# Patient Record
Sex: Female | Born: 1960 | Race: White | Hispanic: No | Marital: Married | Smoking: Former smoker
Health system: Southern US, Community
[De-identification: ages and names within clinical notes are randomized; demographics above are authoritative.]

## PROBLEM LIST (undated history)

## (undated) DIAGNOSIS — F329 Major depressive disorder, single episode, unspecified: Secondary | ICD-10-CM

## (undated) DIAGNOSIS — K589 Irritable bowel syndrome without diarrhea: Secondary | ICD-10-CM

## (undated) DIAGNOSIS — K219 Gastro-esophageal reflux disease without esophagitis: Secondary | ICD-10-CM

## (undated) DIAGNOSIS — F419 Anxiety disorder, unspecified: Secondary | ICD-10-CM

## (undated) DIAGNOSIS — J302 Other seasonal allergic rhinitis: Secondary | ICD-10-CM

## (undated) DIAGNOSIS — B9689 Other specified bacterial agents as the cause of diseases classified elsewhere: Secondary | ICD-10-CM

## (undated) DIAGNOSIS — N809 Endometriosis, unspecified: Secondary | ICD-10-CM

## (undated) DIAGNOSIS — F32A Depression, unspecified: Secondary | ICD-10-CM

## (undated) DIAGNOSIS — J45998 Other asthma: Secondary | ICD-10-CM

## (undated) DIAGNOSIS — R112 Nausea with vomiting, unspecified: Secondary | ICD-10-CM

## (undated) DIAGNOSIS — Z973 Presence of spectacles and contact lenses: Secondary | ICD-10-CM

## (undated) DIAGNOSIS — R102 Pelvic and perineal pain: Secondary | ICD-10-CM

## (undated) DIAGNOSIS — N760 Acute vaginitis: Secondary | ICD-10-CM

## (undated) DIAGNOSIS — Z9889 Other specified postprocedural states: Secondary | ICD-10-CM

## (undated) DIAGNOSIS — IMO0002 Reserved for concepts with insufficient information to code with codable children: Secondary | ICD-10-CM

## (undated) DIAGNOSIS — R350 Frequency of micturition: Secondary | ICD-10-CM

## (undated) DIAGNOSIS — E739 Lactose intolerance, unspecified: Secondary | ICD-10-CM

## (undated) HISTORY — DX: Gastro-esophageal reflux disease without esophagitis: K21.9

## (undated) HISTORY — DX: Irritable bowel syndrome, unspecified: K58.9

## (undated) HISTORY — PX: LAPAROSCOPIC OVARIAN CYSTECTOMY: SUR786

## (undated) HISTORY — DX: Other specified bacterial agents as the cause of diseases classified elsewhere: B96.89

## (undated) HISTORY — DX: Other seasonal allergic rhinitis: J30.2

## (undated) HISTORY — PX: LAPAROSCOPIC OOPHERECTOMY: SHX6507

## (undated) HISTORY — PX: TUBAL LIGATION: SHX77

## (undated) HISTORY — DX: Acute vaginitis: N76.0

---

## 1997-03-27 HISTORY — PX: LAPAROSCOPIC ASSISTED VAGINAL HYSTERECTOMY: SHX5398

## 1997-11-23 ENCOUNTER — Ambulatory Visit (HOSPITAL_COMMUNITY): Admission: RE | Admit: 1997-11-23 | Discharge: 1997-11-23 | Payer: Self-pay | Admitting: *Deleted

## 1998-01-25 ENCOUNTER — Observation Stay (HOSPITAL_COMMUNITY): Admission: RE | Admit: 1998-01-25 | Discharge: 1998-01-26 | Payer: Self-pay | Admitting: *Deleted

## 1998-06-23 ENCOUNTER — Other Ambulatory Visit: Admission: RE | Admit: 1998-06-23 | Discharge: 1998-06-23 | Payer: Self-pay | Admitting: *Deleted

## 1999-08-03 ENCOUNTER — Other Ambulatory Visit: Admission: RE | Admit: 1999-08-03 | Discharge: 1999-08-03 | Payer: Self-pay | Admitting: *Deleted

## 1999-09-23 ENCOUNTER — Ambulatory Visit (HOSPITAL_COMMUNITY): Admission: RE | Admit: 1999-09-23 | Discharge: 1999-09-23 | Payer: Self-pay | Admitting: Gastroenterology

## 2000-08-21 ENCOUNTER — Other Ambulatory Visit: Admission: RE | Admit: 2000-08-21 | Discharge: 2000-08-21 | Payer: Self-pay | Admitting: *Deleted

## 2001-09-03 ENCOUNTER — Other Ambulatory Visit: Admission: RE | Admit: 2001-09-03 | Discharge: 2001-09-03 | Payer: Self-pay | Admitting: *Deleted

## 2002-02-19 ENCOUNTER — Encounter: Payer: Self-pay | Admitting: *Deleted

## 2002-02-19 ENCOUNTER — Ambulatory Visit (HOSPITAL_COMMUNITY): Admission: RE | Admit: 2002-02-19 | Discharge: 2002-02-19 | Payer: Self-pay | Admitting: *Deleted

## 2002-11-12 ENCOUNTER — Other Ambulatory Visit: Admission: RE | Admit: 2002-11-12 | Discharge: 2002-11-12 | Payer: Self-pay | Admitting: *Deleted

## 2003-03-02 ENCOUNTER — Ambulatory Visit (HOSPITAL_COMMUNITY): Admission: RE | Admit: 2003-03-02 | Discharge: 2003-03-02 | Payer: Self-pay | Admitting: *Deleted

## 2003-12-29 ENCOUNTER — Other Ambulatory Visit: Admission: RE | Admit: 2003-12-29 | Discharge: 2003-12-29 | Payer: Self-pay | Admitting: *Deleted

## 2004-04-13 ENCOUNTER — Ambulatory Visit (HOSPITAL_COMMUNITY): Admission: RE | Admit: 2004-04-13 | Discharge: 2004-04-13 | Payer: Self-pay | Admitting: *Deleted

## 2004-12-29 ENCOUNTER — Other Ambulatory Visit: Admission: RE | Admit: 2004-12-29 | Discharge: 2004-12-29 | Payer: Self-pay | Admitting: *Deleted

## 2005-05-19 ENCOUNTER — Ambulatory Visit (HOSPITAL_COMMUNITY): Admission: RE | Admit: 2005-05-19 | Discharge: 2005-05-19 | Payer: Self-pay | Admitting: *Deleted

## 2006-01-16 ENCOUNTER — Other Ambulatory Visit: Admission: RE | Admit: 2006-01-16 | Discharge: 2006-01-16 | Payer: Self-pay | Admitting: *Deleted

## 2006-05-21 ENCOUNTER — Ambulatory Visit (HOSPITAL_COMMUNITY): Admission: RE | Admit: 2006-05-21 | Discharge: 2006-05-21 | Payer: Self-pay | Admitting: *Deleted

## 2007-01-24 ENCOUNTER — Other Ambulatory Visit: Admission: RE | Admit: 2007-01-24 | Discharge: 2007-01-24 | Payer: Self-pay | Admitting: *Deleted

## 2007-05-30 ENCOUNTER — Ambulatory Visit (HOSPITAL_COMMUNITY): Admission: RE | Admit: 2007-05-30 | Discharge: 2007-05-30 | Payer: Self-pay | Admitting: *Deleted

## 2008-04-07 ENCOUNTER — Other Ambulatory Visit: Admission: RE | Admit: 2008-04-07 | Discharge: 2008-04-07 | Payer: Self-pay | Admitting: Family Medicine

## 2008-06-02 ENCOUNTER — Ambulatory Visit (HOSPITAL_COMMUNITY): Admission: RE | Admit: 2008-06-02 | Discharge: 2008-06-02 | Payer: Self-pay | Admitting: Family Medicine

## 2009-06-22 ENCOUNTER — Ambulatory Visit (HOSPITAL_COMMUNITY): Admission: RE | Admit: 2009-06-22 | Discharge: 2009-06-22 | Payer: Self-pay | Admitting: Family Medicine

## 2010-05-16 ENCOUNTER — Other Ambulatory Visit (HOSPITAL_COMMUNITY): Payer: Self-pay | Admitting: Family Medicine

## 2010-05-16 DIAGNOSIS — Z1231 Encounter for screening mammogram for malignant neoplasm of breast: Secondary | ICD-10-CM

## 2010-06-30 ENCOUNTER — Ambulatory Visit (HOSPITAL_COMMUNITY)
Admission: RE | Admit: 2010-06-30 | Discharge: 2010-06-30 | Disposition: A | Discharge status: Home / Self Care | Payer: 59 | Source: Ambulatory Visit | Attending: Family Medicine | Admitting: Family Medicine

## 2010-06-30 DIAGNOSIS — Z1231 Encounter for screening mammogram for malignant neoplasm of breast: Secondary | ICD-10-CM | POA: Insufficient documentation

## 2011-06-26 ENCOUNTER — Other Ambulatory Visit (HOSPITAL_COMMUNITY): Payer: Self-pay | Admitting: Obstetrics and Gynecology

## 2011-06-26 DIAGNOSIS — Z1231 Encounter for screening mammogram for malignant neoplasm of breast: Secondary | ICD-10-CM

## 2011-07-20 ENCOUNTER — Ambulatory Visit (HOSPITAL_COMMUNITY)
Admission: RE | Admit: 2011-07-20 | Discharge: 2011-07-20 | Disposition: A | Discharge status: Home / Self Care | Payer: 59 | Source: Ambulatory Visit | Attending: Obstetrics and Gynecology | Admitting: Obstetrics and Gynecology

## 2011-07-20 DIAGNOSIS — Z1231 Encounter for screening mammogram for malignant neoplasm of breast: Secondary | ICD-10-CM | POA: Insufficient documentation

## 2011-12-06 ENCOUNTER — Ambulatory Visit
Admission: RE | Admit: 2011-12-06 | Discharge: 2011-12-06 | Disposition: A | Discharge status: Home / Self Care | Payer: 59 | Source: Ambulatory Visit | Attending: Family Medicine | Admitting: Family Medicine

## 2011-12-06 ENCOUNTER — Other Ambulatory Visit: Payer: Self-pay | Admitting: Family Medicine

## 2011-12-06 DIAGNOSIS — R0781 Pleurodynia: Secondary | ICD-10-CM

## 2012-09-09 ENCOUNTER — Ambulatory Visit
Admission: RE | Admit: 2012-09-09 | Discharge: 2012-09-09 | Disposition: A | Discharge status: Home / Self Care | Payer: 59 | Source: Ambulatory Visit

## 2012-09-09 ENCOUNTER — Other Ambulatory Visit: Payer: Self-pay

## 2012-09-09 DIAGNOSIS — R0602 Shortness of breath: Secondary | ICD-10-CM

## 2012-09-11 ENCOUNTER — Encounter: Payer: Self-pay | Admitting: Gastroenterology

## 2012-10-08 ENCOUNTER — Encounter: Payer: Self-pay | Admitting: Gastroenterology

## 2012-10-14 ENCOUNTER — Encounter: Payer: Self-pay | Admitting: Gastroenterology

## 2012-10-14 ENCOUNTER — Ambulatory Visit (INDEPENDENT_AMBULATORY_CARE_PROVIDER_SITE_OTHER): Payer: 59 | Admitting: Gastroenterology

## 2012-10-14 VITALS — BP 110/80 | HR 74 | Ht 62.5 in | Wt 120.2 lb

## 2012-10-14 DIAGNOSIS — R1310 Dysphagia, unspecified: Secondary | ICD-10-CM

## 2012-10-14 DIAGNOSIS — Z1211 Encounter for screening for malignant neoplasm of colon: Secondary | ICD-10-CM

## 2012-10-14 NOTE — Patient Instructions (Addendum)
You have been scheduled for an endoscopy with propofol. Please follow written instructions given to you at your visit today. If you use inhalers (even only as needed), please bring them with you on the day of your procedure. Your physician has requested that you go to www.startemmi.com and enter the access code given to you at your visit today. This web site gives a general overview about your procedure. However, you should still follow specific instructions given to you by our office regarding your preparation for the procedure.  You have been scheduled for a colonoscopy with propofol. Please follow written instructions given to you at your visit today.  Please pick up your prep kit at the pharmacy within the next 1-3 days. If you use inhalers (even only as needed), please bring them with you on the day of your procedure. Your physician has requested that you go to www.[email or web address] and enter the access code given to you at your visit today. This web site gives a general overview about your procedure. However, you should still follow specific instructions given to you by our office regarding your preparation for the procedure. 

## 2012-10-14 NOTE — Assessment & Plan Note (Signed)
Dysphagia may be do to a recurrent esophageal stricture. Candida esophagitis should be ruled out.  Recommendations #1 upper endoscopy with dilatation as indicated

## 2012-10-14 NOTE — Assessment & Plan Note (Signed)
Plan screening colonoscopy 

## 2012-10-14 NOTE — Progress Notes (Signed)
History of Present Illness: 52 year old white female referred for colorectal cancer screening. She has a questionable remote history of Crohn's disease. Last colonoscopy in 1998 was negative. Bowels are somewhat irregular which she attributes to irritable bowel.  Patient complains of dysphagia to solids. She apparently has a history of an esophageal stricture. She has recently been treated for asthma including prednisone. She is taking fluconazole as well.    Past Medical History  Diagnosis Date  . GERD (gastroesophageal reflux disease)   . Irritable bowel syndrome   . Food intolerance     dairy   History reviewed. No pertinent past surgical history. family history is negative for Colon cancer and Colon polyps. Current Outpatient Prescriptions  Medication Sig Dispense Refill  . Calcium Carbonate-Vitamin D (CALCIUM + D PO) Take by mouth. Two daily      . Hyoscyamine Sulfate (LEVSIN/SL SL) Place under the tongue. As needed      . Multiple Vitamins-Minerals (MULTIVITAMIN PO) Take by mouth.      . Ranitidine HCl (ZANTAC PO) Take by mouth. Twice daily      . ALPRAZolam (XANAX) 1 MG tablet As needed      . amitriptyline (ELAVIL) 10 MG tablet       . nefazodone (SERZONE) 150 MG tablet        No current facility-administered medications for this visit.   Allergies as of 10/14/2012 - Review Complete 10/14/2012  Allergen Reaction Noted  . Est estrogens-methyltest Other (See Comments) 10/14/2012  . Penicillins Rash 10/14/2012    reports that she has quit smoking. She has never used smokeless tobacco. She reports that  drinks alcohol. She reports that she does not use illicit drugs.     Review of Systems: Pertinent positive and negative review of systems were noted in the above HPI section. All other review of systems were otherwise negative.  Vital signs were reviewed in today's medical record Physical Exam: General: Well developed , well nourished, no acute distress Skin:  anicteric Head: Normocephalic and atraumatic Eyes:  sclerae anicteric, EOMI Ears: Normal auditory acuity Mouth: No deformity or lesions Neck: Supple, no masses or thyromegaly Lungs: Clear throughout to auscultation Heart: Regular rate and rhythm; no murmurs, rubs or bruits Abdomen: Soft, non tender and non distended. No masses, hepatosplenomegaly or hernias noted. Normal Bowel sounds Rectal:deferred Musculoskeletal: Symmetrical with no gross deformities  Skin: No lesions on visible extremities Pulses:  Normal pulses noted Extremities: No clubbing, cyanosis, edema or deformities noted Neurological: Alert oriented x 4, grossly nonfocal Cervical Nodes:  No significant cervical adenopathy Inguinal Nodes: No significant inguinal adenopathy Psychological:  Alert and cooperative. Normal mood and affect

## 2012-10-15 ENCOUNTER — Encounter: Payer: Self-pay | Admitting: Gastroenterology

## 2012-10-16 ENCOUNTER — Telehealth: Payer: Self-pay | Admitting: Gastroenterology

## 2012-10-16 NOTE — Telephone Encounter (Signed)
Pt thought she was supposed to have an EGD and then be scheduled for the colon. She has her colon scheduled 10/24/12 and her EGD is scheduled for 11/05/12. Pt wants to make sure this is ok with Dr. Arlyce Dice. Please advise.

## 2012-10-16 NOTE — Telephone Encounter (Signed)
Spoke with pt and she is aware.

## 2012-10-16 NOTE — Telephone Encounter (Signed)
OK.  I thought that she should have her EGD first since she has dysphagia but the order it is not critical

## 2012-10-24 ENCOUNTER — Ambulatory Visit (AMBULATORY_SURGERY_CENTER): Payer: 59 | Admitting: Gastroenterology

## 2012-10-24 ENCOUNTER — Encounter: Payer: Self-pay | Admitting: Gastroenterology

## 2012-10-24 VITALS — BP 121/71 | HR 63 | Temp 98.3°F | Resp 17 | Ht 62.5 in | Wt 120.0 lb

## 2012-10-24 DIAGNOSIS — Z1211 Encounter for screening for malignant neoplasm of colon: Secondary | ICD-10-CM

## 2012-10-24 HISTORY — PX: COLONOSCOPY WITH PROPOFOL: SHX5780

## 2012-10-24 MED ORDER — SODIUM CHLORIDE 0.9 % IV SOLN: 500.0000 mL | INTRAVENOUS | Status: DC

## 2012-10-24 NOTE — Patient Instructions (Signed)
YOU HAD AN ENDOSCOPIC PROCEDURE TODAY AT THE [facility]: Refer to the procedure report that was given to you for any specific questions about what was found during the examination.  If the procedure report does not answer your questions, please call your gastroenterologist to clarify.  If you requested that your care partner not be given the details of your procedure findings, then the procedure report has been included in a sealed envelope for you to review at your convenience later.  YOU SHOULD EXPECT: Some feelings of bloating in the abdomen. Passage of more gas than usual.  Walking can help get rid of the air that was put into your GI tract during the procedure and reduce the bloating. If you had a lower endoscopy (such as a colonoscopy or flexible sigmoidoscopy) you may notice spotting of blood in your stool or on the toilet paper. If you underwent a bowel prep for your procedure, then you may not have a normal bowel movement for a few days.  DIET: Your first meal following the procedure should be a light meal and then it is ok to progress to your normal diet.  A half-sandwich or bowl of soup is an example of a good first meal.  Heavy or fried foods are harder to digest and may make you feel nauseous or bloated.  Likewise meals heavy in dairy and vegetables can cause extra gas to form and this can also increase the bloating.  Drink plenty of fluids but you should avoid alcoholic beverages for 24 hours.  ACTIVITY: Your care partner should take you home directly after the procedure.  You should plan to take it easy, moving slowly for the rest of the day.  You can resume normal activity the day after the procedure however you should NOT DRIVE or use heavy machinery for 24 hours (because of the sedation medicines used during the test).    SYMPTOMS TO REPORT IMMEDIATELY: A gastroenterologist can be reached at any hour.  During normal business hours, 8:30 AM to 5:00 PM Monday through Friday,  call [phone].  After hours and on weekends, please call the GI answering service at [phone] who will take a message and have the physician on call contact you.   Following lower endoscopy (colonoscopy or flexible sigmoidoscopy):  Excessive amounts of blood in the stool  Significant tenderness or worsening of abdominal pains  Swelling of the abdomen that is new, acute  Fever of 100F or higher    FOLLOW UP: If any biopsies were taken you will be contacted by phone or by letter within the next 1-3 weeks.  Call your gastroenterologist if you have not heard about the biopsies in 3 weeks.  Our staff will call the home number listed on your records the next business day following your procedure to check on you and address any questions or concerns that you may have at that time regarding the information given to you following your procedure. This is a courtesy call and so if there is no answer at the home number and we have not heard from you through the emergency physician on call, we will assume that you have returned to your regular daily activities without incident.  SIGNATURES/CONFIDENTIALITY: You and/or your care partner have signed paperwork which will be entered into your electronic medical record.  These signatures attest to the fact that that the information above on your After Visit Summary has been reviewed and is understood.  Full responsibility of the confidentiality   of this discharge information lies with you and/or your care-partner.     

## 2012-10-24 NOTE — Op Note (Signed)
[facility] [address] [place], [address]   COLONOSCOPY PROCEDURE REPORT  PATIENT: Mikayla, Montes  MR#: [id number] BIRTHDATE: 1960-06-09 , 51  yrs. old GENDER: Female ENDOSCOPIST: Louis Meckel, MD REFERRED [id number] Foy [place], M.D. PROCEDURE DATE:  10/24/2012 PROCEDURE:   Colonoscopy, diagnostic First Screening Colonoscopy - Avg.  risk and is 50 yrs.  old or older Yes.  Prior Negative Screening - Now for repeat screening. N/A  History of Adenoma - Now for follow-up colonoscopy & has been > or = to 3 yrs.  N/A  Polyps Removed Today? No.  Recommend repeat exam, <10 yrs? No. ASA CLASS:   Class II INDICATIONS:Average risk patient for colon cancer. MEDICATIONS: MAC sedation, administered by CRNA and propofol (Diprivan) 200mg  IV  DESCRIPTION OF PROCEDURE:   After the risks benefits and alternatives of the procedure were thoroughly explained, informed consent was obtained.  A digital rectal exam revealed no abnormalities of the rectum.   The [id number]  endoscope was introduced through the anus and advanced to the cecum, which was identified by both the appendix and ileocecal valve. No adverse events experienced.   The quality of the prep was excellent using Suprep  The instrument was then slowly withdrawn as the colon was fully examined.      COLON FINDINGS: A normal appearing cecum, ileocecal valve, and appendiceal orifice were identified.  The ascending, hepatic flexure, transverse, splenic flexure, descending, sigmoid colon and rectum appeared unremarkable.  No polyps or cancers were seen. Retroflexed views revealed no abnormalities. The time to cecum=4 minutes 34 seconds.  Withdrawal time=7 minutes 50 seconds.  The scope was withdrawn and the procedure completed. COMPLICATIONS: There were no complications.  ENDOSCOPIC IMPRESSION: Normal colon  RECOMMENDATIONS: Continue current colorectal screening recommendations for "routine risk"  patients with a repeat colonoscopy in 10 years.   eSigned:  Louis Meckel, MD 10/24/2012 1:56 PM   cc:

## 2012-10-24 NOTE — Progress Notes (Signed)
No egg or soy allergy. ewm 

## 2012-10-24 NOTE — Progress Notes (Signed)
Patient did not experience any of the following events: a burn prior to discharge; a fall within the facility; wrong site/side/patient/procedure/implant event; or a hospital transfer or hospital admission upon discharge from the facility. (G8907) Patient did not have preoperative order for IV antibiotic SSI prophylaxis. (G8918)  

## 2012-10-24 NOTE — Progress Notes (Signed)
Procedure ends, to recovery, report given and VSS. 

## 2012-10-25 ENCOUNTER — Encounter: Payer: Self-pay | Admitting: *Deleted

## 2012-10-25 ENCOUNTER — Telehealth: Payer: Self-pay

## 2012-10-25 NOTE — Telephone Encounter (Signed)
  Follow up Call-  Call back number 10/24/2012  Post procedure Call Back phone  # [phone]  Permission to leave phone message Yes     Patient questions:  Do you have a fever, pain , or abdominal swelling? no Pain Score  0 *  Have you tolerated food without any problems? yes  Have you been able to return to your normal activities? yes  Do you have any questions about your discharge instructions: Diet   no Medications  no Follow up visit  no  Do you have questions or concerns about your Care? no  Actions: * If pain score is 4 or above: No action needed, pain <4.

## 2012-10-25 NOTE — Telephone Encounter (Signed)
Error, someone already called

## 2012-10-30 ENCOUNTER — Other Ambulatory Visit (HOSPITAL_COMMUNITY): Payer: Self-pay | Admitting: Obstetrics and Gynecology

## 2012-10-30 DIAGNOSIS — Z1231 Encounter for screening mammogram for malignant neoplasm of breast: Secondary | ICD-10-CM

## 2012-11-04 ENCOUNTER — Telehealth: Payer: Self-pay | Admitting: *Deleted

## 2012-11-04 NOTE — Telephone Encounter (Signed)
Open in error

## 2012-11-05 ENCOUNTER — Encounter: Payer: 59 | Admitting: Gastroenterology

## 2012-11-20 ENCOUNTER — Ambulatory Visit (HOSPITAL_COMMUNITY): Payer: 59

## 2012-12-05 ENCOUNTER — Ambulatory Visit (HOSPITAL_COMMUNITY)
Admission: RE | Admit: 2012-12-05 | Discharge: 2012-12-05 | Disposition: A | Discharge status: Home / Self Care | Payer: 59 | Source: Ambulatory Visit | Attending: Obstetrics and Gynecology | Admitting: Obstetrics and Gynecology

## 2012-12-05 DIAGNOSIS — Z1231 Encounter for screening mammogram for malignant neoplasm of breast: Secondary | ICD-10-CM | POA: Insufficient documentation

## 2013-08-22 ENCOUNTER — Encounter: Payer: Self-pay | Admitting: Gynecology

## 2013-08-22 ENCOUNTER — Ambulatory Visit (INDEPENDENT_AMBULATORY_CARE_PROVIDER_SITE_OTHER): Payer: 59 | Admitting: Gynecology

## 2013-08-22 VITALS — BP 112/66 | Ht 62.0 in | Wt 114.0 lb

## 2013-08-22 DIAGNOSIS — R102 Pelvic and perineal pain: Secondary | ICD-10-CM

## 2013-08-22 DIAGNOSIS — N898 Other specified noninflammatory disorders of vagina: Secondary | ICD-10-CM

## 2013-08-22 DIAGNOSIS — N949 Unspecified condition associated with female genital organs and menstrual cycle: Secondary | ICD-10-CM

## 2013-08-22 LAB — WET PREP FOR TRICH, YEAST, CLUE
CLUE CELLS WET PREP: NONE SEEN
Trich, Wet Prep: NONE SEEN
WBC WET PREP: NONE SEEN
YEAST WET PREP: NONE SEEN

## 2013-08-22 MED ORDER — ESTROGENS, CONJUGATED 0.625 MG/GM VA CREA: TOPICAL_CREAM | VAGINAL | Status: DC

## 2013-08-22 NOTE — Progress Notes (Signed)
Mikayla Montes 1961-02-24 [id number]        53 y.o.  G1P0 new patient presents with a complex history to include abdominal hysterectomy late 1990s for endometriosis. Subsequent laparoscopy x3 by Dr. Warnell Forester for ovarian cysts. (No records available to review at this time)  Started on estrogen replacement at that time with 2 mg of Estrace daily, had tried other products such as vaginal estrogen and Estring. She did have an episode of pelvic pain/vaginal burning similar to now which ultimately resolved. Had done well historically until several months ago with the onset of intense episodic vaginal/rectal burning described as "hot curling iron inside". It occurs multiple times daily. Also notes urinary frequency with some pain. Had been seen by Dr. Emilie Rutter on multiple occasions most recently and currently is using Percocet 10-325 mg for the pain. Elavil 25 mg at bedtime. They increased her Estrace to 4 mg daily thinking that it was due to hypoestrogenic atrophic vaginal changes. Saw Dr. Matilde Sprang who did not feel it was urologic although did no studies by her history and did not do cystoscopy. She denies having ultrasounds CT scans or other studies. Is not sexually active because of the pain. No nausea vomiting diarrhea constipation. No radiation of the pain into her extremities or back.   Past medical history,surgical history, problem list, medications, allergies, family history and social history were all reviewed and documented as reviewed in the EPIC chart.  ROS:  Performed with pertinent positives and negatives in the history of present illness   Exam: Kim assistant Filed Vitals:   08/22/13 1150  BP: 112/66  Height: 5\' 2"  (1.575 m)  Weight: 114 lb (51.71 kg)   General appearance:  Normal affect, orientation and appearance. Skin: Grossly normal Spine straight without CVA tenderness. Abdominal:  Active bowel sounds. Soft, nontender, without masses, guarding, rebound, organomegaly or  hernia Pelvic:  Ext/BUS/vagina with white vaginal cream otherwise normal. Digital exam elicits no discomfort  Adnexa  Without masses or tenderness    Anus and perineum  Normal   Rectovaginal  Normal sphincter tone without palpated masses or tenderness.    Assessment/Plan:  53 y.o. G1P0 new patient with several month history of intense pelvic/vaginal/rectal burning episodic pain requiring narcotic pain relief. Was being treated for hypoestrogenic etiology currently on 4 mg of Estrace. Was started on Premarin vaginal cream but used it once and had an episode of vomiting which also followed taking a narcotic pill, she called the office who prescribed it and was told to stop the Premarin cream. Vagina appears well estrogenized. History of endometriosis status post hysterectomy. Multiple laparoscopies for "ovarian cysts". Urologic evaluation negative historically although no cystoscopy was done. Exam today is normal without any pain on abdominal or pelvic exam. Differential to include GYN etiology such as endometriosis/adhesions versus urologic such as interstitial cystitis given her history of frequency and some discomfort with urination. GI less likely without history of bowel changes such as nausea vomiting diarrhea constipation. Possible orthopedic although no radiation or association with joint movement. Will awaken with the pain at times. Will start with pelvic ultrasound to rule out nonpalpable abnormalities. Decrease estradiol to 1 mg daily, continue Premarin vaginal cream twice weekly for now, continue Elavil 25 mg at bedtime, limit or stop her narcotic use. Assuming ultrasound negative from my standpoint next up would be laparoscopy. I do think that she needs cystoscopy to rule out interstitial cystitis whether that should be done first we will discuss. Ultimate possible referral to the pelvic  pain clinic at [facility] also discussed.  The patient does have Dr Collier Bullock original records at home and she's  going to look through them and bring copies of any operative reports that she may have. I will also attempt to get copies from the hospital.   Note: This document was prepared with digital dictation and possible smart phrase technology. Any transcriptional errors that result from this process are unintentional.   Anastasio Auerbach MD, 12:31 PM 08/22/2013

## 2013-08-22 NOTE — Patient Instructions (Signed)
Followup for ultrasound as scheduled. Use the Premarin vaginal cream twice weekly. Decrease the oral estrogen to 1 mg daily. Stay on the Elavil 25 mg at bedtime Try to limit/stop the use of the narcotics.

## 2013-08-23 LAB — URINALYSIS W MICROSCOPIC + REFLEX CULTURE
BACTERIA UA: NONE SEEN
Bilirubin Urine: NEGATIVE
CASTS: NONE SEEN
Crystals: NONE SEEN
Glucose, UA: NEGATIVE mg/dL
Hgb urine dipstick: NEGATIVE
KETONES UR: NEGATIVE mg/dL
Leukocytes, UA: NEGATIVE
NITRITE: NEGATIVE
PH: 7.5 (ref 5.0–8.0)
Protein, ur: NEGATIVE mg/dL
Specific Gravity, Urine: 1.005 (ref 1.005–1.030)
UROBILINOGEN UA: 0.2 mg/dL (ref 0.0–1.0)

## 2013-08-27 ENCOUNTER — Encounter: Payer: Self-pay | Admitting: Gynecology

## 2013-08-27 ENCOUNTER — Other Ambulatory Visit: Payer: Self-pay | Admitting: Gynecology

## 2013-08-27 ENCOUNTER — Ambulatory Visit (INDEPENDENT_AMBULATORY_CARE_PROVIDER_SITE_OTHER): Payer: 59 | Admitting: Gynecology

## 2013-08-27 ENCOUNTER — Ambulatory Visit (INDEPENDENT_AMBULATORY_CARE_PROVIDER_SITE_OTHER): Payer: 59

## 2013-08-27 DIAGNOSIS — IMO0002 Reserved for concepts with insufficient information to code with codable children: Secondary | ICD-10-CM

## 2013-08-27 DIAGNOSIS — N83209 Unspecified ovarian cyst, unspecified side: Secondary | ICD-10-CM

## 2013-08-27 DIAGNOSIS — N949 Unspecified condition associated with female genital organs and menstrual cycle: Secondary | ICD-10-CM

## 2013-08-27 DIAGNOSIS — Z8742 Personal history of other diseases of the female genital tract: Secondary | ICD-10-CM

## 2013-08-27 DIAGNOSIS — R102 Pelvic and perineal pain unspecified side: Secondary | ICD-10-CM

## 2013-08-27 NOTE — Progress Notes (Signed)
Mikayla Montes [birth year] [id number]        53 y.o.  G1P0 presents in followup for ultrasound. Very complex history to include recurrent bouts of pelvic pain and vaginal burning. Previously taken care of by Dr. Warnell Forester status post laparoscopy x3 by patient's history in the late 1990s. I do have one operative report she brought where he described ovarian cysts, periovarian adhesions and presumed endometriosis all of which he ablated. No pathology specimens were taken. Subsequently underwent LAVH 1999 where again endometriosis was described and ovarian cysts. These were ablated. The uterine pathology was normal with the exception of some serosal adhesions but no evidence of endometriosis. Had been on ERT since then for hot flushes night sweats and not feeling well and had been on several different formulations. Most recently saw Dr. Milta Deiters for her recurrent pelvic pain and burning and she presented to me on Estrace 4 mg daily, vaginal Premarin cream and narcotic pain medication. Patient notes that the pain has been going on for 2 months and is incapacitating although on my last physical exam she had no pain on abdominal or pelvic exam. She relates some back pain and radiation into her legs and that this was identical pain that she had previously with the laparoscopies that resolved after ablation of her ovarian cysts and presumed endometriosis.  Past medical history,surgical history, problem list, medications, allergies, family history and social history were all reviewed and documented in the EPIC chart.  Directed ROS with pertinent positives and negatives documented in the history of present illness/assessment and plan.  Ultrasound shows right and left ovaries visualized with 21 mm echo-free avascular right ovarian cyst. Cul-de-sac negative.  Assessment/Plan:  53 y.o. G1P0 with complex history as outlined above. Reports recurrent pelvic burning, pain with radiation to her back and lower legs that responded  to laparoscopy with ovarian cyst fulguration and fulguration of suspected endometriosis. Operative reports lack detail and there is no pathology from any of the surgeries other than her LAVH which showed a normal uterus. I reviewed with her that highly unlikely endometriosis would be causing her pelvic burning although her history of a getting better after laparoscopy raises a possibility. She also has been on a higher dose of ERT which may have stimulated endometriosis. She has gone down to 1 mg daily. She has been on ERT for years.  She saw the urologist who did not feel was interstitial cystitis historically but did not cystoscope her. Options for management would be to proceed with cystoscopy rule out interstitial cystitis. Laparoscopy rule out endometriosis. Stopping estrogen altogether with possible Depo-Lupron if ovarian function continues to see if this doesn't suppress her pain. Referral to a pain clinic. My concern is that if we proceed with laparoscopy she will continue with her pelvic pain at which point I discussed with her that I would have nothing further to offer and she would then have to pursue a more aggressive urologic or pain clinic approach. The patient feels her strongly that she wants to proceed with laparoscopy having received benefit before. She did ask if I could take her ovaries out at this time as that was the plan from Dr. Warnell Forester if he ever was going to repeat the laparoscope he was going to do a salpingo-oophorectomy. She is 52 already on ERT and I think this would be a reasonable approach if we are going to laparoscope her to go ahead and eliminate the ovaries as any possible source of her pain. She does recognize that  I may be removing normal ovaries but it does not sound like they are beneficial from a transition through menopause as she is symptomatic without her ERT. Options to stop estrogen and check an [facility] also reviewed but ultimately the patient wants the ovaries removed  regardless. I emphasized several times that there is no guarantee that this will take care of her pain and that her presentation is very atypical and I do have concern that this will not help but I think it is certainly reasonable to proceed with this given her past repetitive descriptive history of endometriosis. We will go ahead and arrange for this. I again reviewed with her I really would like her to limit or stop her use of the narcotics. I talked about possible Neurontin but she said she tried this before and it sedated her too much.   Note: This document was prepared with digital dictation and possible smart phrase technology. Any transcriptional errors that result from this process are unintentional.   Anastasio Auerbach MD, 9:51 AM 08/27/2013

## 2013-08-27 NOTE — Patient Instructions (Signed)
Office will call you to arrange surgery. 

## 2013-09-01 ENCOUNTER — Encounter: Payer: Self-pay | Admitting: Nurse Practitioner

## 2013-09-05 ENCOUNTER — Ambulatory Visit: Payer: Self-pay | Admitting: Gynecology

## 2013-09-09 ENCOUNTER — Encounter: Payer: Self-pay | Admitting: Gynecology

## 2013-09-09 ENCOUNTER — Ambulatory Visit (INDEPENDENT_AMBULATORY_CARE_PROVIDER_SITE_OTHER): Payer: 59 | Admitting: Gynecology

## 2013-09-09 DIAGNOSIS — N949 Unspecified condition associated with female genital organs and menstrual cycle: Secondary | ICD-10-CM

## 2013-09-09 DIAGNOSIS — R102 Pelvic and perineal pain: Secondary | ICD-10-CM

## 2013-09-09 NOTE — Patient Instructions (Signed)
Followup for surgery as scheduled. 

## 2013-09-09 NOTE — Progress Notes (Signed)
Mikayla Montes 02-Dec-1960 [id number]   Preoperative consult  Chief complaint: Pelvic pain, history of endometriosis  History of present illness: 53 y.o. G1P0 with very complex history to include recurrent bouts of pelvic pain and vaginal burning. Previously taken care of by Dr. Warnell Forester status post laparoscopy x3 by patient's history in the late 1990s. I do have one operative report she brought where he described ovarian cysts, periovarian adhesions and presumed endometriosis all of which he ablated. No pathology specimens were taken. Subsequently underwent LAVH 1999 where again endometriosis was described and ovarian cysts. These were ablated. The uterine pathology was normal with the exception of some serosal adhesions but no evidence of endometriosis. Had been on ERT since then for hot flushes night sweats and not feeling well and had been on several different formulations. Most recently saw Dr. Maryruth Hancock for her recurrent pelvic pain and burning and she presented to me on Estrace 4 mg daily, vaginal Premarin cream and narcotic pain medication. Patient notes that the pain has been going on for 2 months and is incapacitating although on my last physical exam she had no pain on abdominal or pelvic exam. Ultrasound showed a 21 mm echo-free avascular right ovarian cyst, left ovary normal, cul-de-sac negative.  She relates some back pain and radiation into her legs and that this was identical pain that she had previously with the laparoscopies that resolved after ablation of her ovarian cysts and presumed endometriosis. She saw urology in consultation who did not feel that it was urologic. She had a screening colonoscopy 09/2012 which was reportedly normal. Options for management were reviewed to include more aggressive evaluation by GI and urology, referral to pain clinic at [facility], diagnostic laparoscopy with or without bilateral salpingo-oophorectomy. Patient ultimately has decided on laparoscopy with  bilateral salpingo-oophorectomy.   Past medical history,surgical history, medications, allergies, family history and social history were all reviewed and documented in the EPIC chart. ROS:  Was performed and pertinent positives and negatives are included in the history of present illness.  Exam:  Kim assistant General: well developed, well nourished female, no acute distress HEENT: normal  Lungs: clear to auscultation without wheezing, rales or rhonchi  Cardiac: regular rate without rubs, murmurs or gallops  Abdomen: soft, nontender without masses, guarding, rebound, organomegaly  Pelvic: external bus vagina: normal   Adnexa: without masses or tenderness  Rectovaginal exam within normal limits    Assessment/Plan:  53 y.o. G1P0 with history as outlined above. I have had a very lengthy discussion with her about the situation as noted in my 08/27/2013 note. I again reviewed my concern that she may continue to have persistent pain after the laparoscopic BSO and at that point she would have to pursue other evaluations such as GI, urology or chronic pain clinic. I also discussed again with her about removing healthy appearing ovaries in the perimenopause and she feels very strongly that she wants her ovaries and fallopian tube segments removed regardless of findings to eliminate that as a potential source for pain either now or in the future. She is on ERT already and we'll plan to continue her on this afterwards. The risks of ERT reviewed to include the WHI study stroke, heart attack, DVT and possible breast cancer issues. The patient also understands that we'll initiate a laparoscopic approach but due to her multiple prior surgeries and possibility for adhesions that I may need to make a larger incision with an exploratory laparotomy to accomplish the surgery and that this will  mean a longer recovery and larger incision with more pain in the postoperative period. She also understands that I may abandon  the surgery if significant scarring is involved that would require more extensive surgery if I felt that this would put her at a much higher risk of inadvertent organ damage. The expected intraoperative/postoperative and recovery period were reviewed.  Multiple port sites, trocar placement, insufflation, use of sharp and blunt dissection, electrocautery, Harmonic scalpel and laser were reviewed. The risks of infection requiring prolonged antibiotics as well as risk of hemorrhage necessitating transfusion and the risks of transfusion including transfusion reaction, hepatitis, HIV, mad cow disease, other unknown entities were reviewed with her. Incisional complications requiring opening and draining of incisions, closure by secondary intention, long-term issues of cosmetic/keloid and hernia formation were reviewed with her also. The realistic risk of inadvertent injury to internal organs including bowel, bladder, ureters, vessels and nerves necessitating major exploratory reparative surgeries and future repair surgeries, either immediately recognized or delay recognized, including bowel resection, ostomy formation, bladder repair, ureteral damage repair was all discussed with her. The patient's questions were answered to her satisfaction and she is ready to proceed with surgery.   Note: This document was prepared with digital dictation and possible smart phrase technology. Any transcriptional errors that result from this process are unintentional.  Anastasio Auerbach MD, 4:49 PM 09/09/2013

## 2013-09-09 NOTE — H&P (Signed)
ZOA DOWTY 05-14-1960 [id number]   History and Physical  Chief complaint: Pelvic pain, history of endometriosis  History of present illness: 53 y.o. G1P0 with very complex history to include recurrent bouts of pelvic pain and vaginal burning. Previously taken care of by Dr. Warnell Forester status post laparoscopy x3 by patient's history in the late 1990s. I do have one operative report she brought where he described ovarian cysts, periovarian adhesions and presumed endometriosis all of which he ablated. No pathology specimens were taken. Subsequently underwent LAVH 1999 where again endometriosis was described and ovarian cysts. These were ablated. The uterine pathology was normal with the exception of some serosal adhesions but no evidence of endometriosis. Had been on ERT since then for hot flushes night sweats and not feeling well and had been on several different formulations. Most recently saw Dr. Maryruth Hancock for her recurrent pelvic pain and burning and she presented to me on Estrace 4 mg daily, vaginal Premarin cream and narcotic pain medication. Patient notes that the pain has been going on for 2 months and is incapacitating although on my last physical exam she had no pain on abdominal or pelvic exam. Ultrasound showed a 21 mm echo-free avascular right ovarian cyst, left ovary normal, cul-de-sac negative.  She relates some back pain and radiation into her legs and that this was identical pain that she had previously with the laparoscopies that resolved after ablation of her ovarian cysts and presumed endometriosis. She saw urology in consultation who did not feel that it was urologic. She had a screening colonoscopy 09/2012 which was reportedly normal. Options for management were reviewed to include more aggressive evaluation by GI and urology, referral to pain clinic at [facility], diagnostic laparoscopy with or without bilateral salpingo-oophorectomy. Patient ultimately has decided on laparoscopy with  bilateral salpingo-oophorectomy.   Past medical history,surgical history, medications, allergies, family history and social history were all reviewed and documented in the EPIC chart. ROS:  Was performed and pertinent positives and negatives are included in the history of present illness.  Exam:  Kim assistant General: well developed, well nourished female, no acute distress HEENT: normal  Lungs: clear to auscultation without wheezing, rales or rhonchi  Cardiac: regular rate without rubs, murmurs or gallops  Abdomen: soft, nontender without masses, guarding, rebound, organomegaly  Pelvic: external bus vagina: normal   Adnexa: without masses or tenderness  Rectovaginal exam within normal limits    Assessment/Plan:  53 y.o. G1P0 with history as outlined above. I have had a very lengthy discussion with her about the situation as noted in my 08/27/2013 note. I again reviewed my concern that she may continue to have persistent pain after the laparoscopic BSO and at that point she would have to pursue other evaluations such as GI, urology or chronic pain clinic. I also discussed again with her about removing healthy appearing ovaries in the perimenopause and she feels very strongly that she wants her ovaries and fallopian tube segments removed regardless of findings to eliminate that as a potential source for pain either now or in the future. She is on ERT already and we'll plan to continue her on this afterwards. The risks of ERT reviewed to include the WHI study stroke, heart attack, DVT and possible breast cancer issues. The patient also understands that we'll initiate a laparoscopic approach but due to her multiple prior surgeries and possibility for adhesions that I may need to make a larger incision with an exploratory laparotomy to accomplish the surgery and that this  will mean a longer recovery and larger incision with more pain in the postoperative period. She also understands that I may abandon  the surgery if significant scarring is involved that would require more extensive surgery if I felt that this would put her at a much higher risk of inadvertent organ damage. The expected intraoperative/postoperative and recovery period were reviewed.  Multiple port sites, trocar placement, insufflation, use of sharp and blunt dissection, electrocautery, Harmonic scalpel and laser were reviewed. The risks of infection requiring prolonged antibiotics as well as risk of hemorrhage necessitating transfusion and the risks of transfusion including transfusion reaction, hepatitis, HIV, mad cow disease, other unknown entities were reviewed with her. Incisional complications requiring opening and draining of incisions, closure by secondary intention, long-term issues of cosmetic/keloid and hernia formation were reviewed with her also. The realistic risk of inadvertent injury to internal organs including bowel, bladder, ureters, vessels and nerves necessitating major exploratory reparative surgeries and future repair surgeries, either immediately recognized or delay recognized, including bowel resection, ostomy formation, bladder repair, ureteral damage repair was all discussed with her. The patient's questions were answered to her satisfaction and she is ready to proceed with surgery.    Note: This document was prepared with digital dictation and possible smart phrase technology. Any transcriptional errors that result from this process are unintentional.  Anastasio Auerbach MD, 5:04 PM 09/09/2013

## 2013-09-11 ENCOUNTER — Encounter (HOSPITAL_COMMUNITY): Payer: Self-pay | Admitting: *Deleted

## 2013-09-15 ENCOUNTER — Encounter (HOSPITAL_COMMUNITY): Payer: Self-pay | Admitting: Pharmacist

## 2013-09-17 ENCOUNTER — Telehealth: Payer: Self-pay | Admitting: *Deleted

## 2013-09-17 MED ORDER — ESTRADIOL 2 MG PO TABS: 2.0000 mg | ORAL_TABLET | Freq: Every day | ORAL | Status: DC

## 2013-09-17 NOTE — Telephone Encounter (Signed)
Pt called requesting refill on estradiol 2 mg, per note "Decrease estradiol to 1 mg daily, continue Premarin vaginal cream twice weekly for now" I explained this to patient and she said that 2 mg having working fine for her.pt said you told her okay to hold off on estradiol 1 mg for now. Okay to refill estradiol 2 mg? Please advise

## 2013-09-17 NOTE — Telephone Encounter (Signed)
rx sent to mail order pharmacy informed.

## 2013-09-17 NOTE — Telephone Encounter (Signed)
Okay to refill estradiol 2 mg #30 refill x6

## 2013-09-18 MED ORDER — CIPROFLOXACIN IN D5W 400 MG/200ML IV SOLN
400.0000 mg | INTRAVENOUS | Status: AC
  Administered 2013-09-19: 400 mg via INTRAVENOUS
  Filled 2013-09-18: qty 200

## 2013-09-18 MED ORDER — METRONIDAZOLE IN NACL 5-0.79 MG/ML-% IV SOLN
500.0000 mg | INTRAVENOUS | Status: AC
  Administered 2013-09-19: .5 g via INTRAVENOUS
  Filled 2013-09-18: qty 100

## 2013-09-19 ENCOUNTER — Encounter (HOSPITAL_COMMUNITY): Payer: 59 | Admitting: Certified Registered"

## 2013-09-19 ENCOUNTER — Ambulatory Visit (HOSPITAL_COMMUNITY)
Admission: RE | Admit: 2013-09-19 | Discharge: 2013-09-19 | Disposition: A | Discharge status: Home / Self Care | Payer: 59 | Source: Ambulatory Visit | Attending: Gynecology | Admitting: Gynecology

## 2013-09-19 ENCOUNTER — Ambulatory Visit (HOSPITAL_COMMUNITY): Payer: 59 | Admitting: Certified Registered"

## 2013-09-19 ENCOUNTER — Encounter (HOSPITAL_COMMUNITY)
Admission: RE | Disposition: A | Discharge status: Home / Self Care | Payer: Self-pay | Source: Ambulatory Visit | Attending: Gynecology

## 2013-09-19 ENCOUNTER — Encounter (HOSPITAL_COMMUNITY): Payer: Self-pay | Admitting: *Deleted

## 2013-09-19 DIAGNOSIS — Z792 Long term (current) use of antibiotics: Secondary | ICD-10-CM | POA: Insufficient documentation

## 2013-09-19 DIAGNOSIS — R61 Generalized hyperhidrosis: Secondary | ICD-10-CM | POA: Insufficient documentation

## 2013-09-19 DIAGNOSIS — K219 Gastro-esophageal reflux disease without esophagitis: Secondary | ICD-10-CM | POA: Insufficient documentation

## 2013-09-19 DIAGNOSIS — N838 Other noninflammatory disorders of ovary, fallopian tube and broad ligament: Secondary | ICD-10-CM | POA: Insufficient documentation

## 2013-09-19 DIAGNOSIS — Z87891 Personal history of nicotine dependence: Secondary | ICD-10-CM | POA: Insufficient documentation

## 2013-09-19 DIAGNOSIS — N949 Unspecified condition associated with female genital organs and menstrual cycle: Secondary | ICD-10-CM

## 2013-09-19 DIAGNOSIS — K668 Other specified disorders of peritoneum: Secondary | ICD-10-CM | POA: Insufficient documentation

## 2013-09-19 DIAGNOSIS — R1903 Right lower quadrant abdominal swelling, mass and lump: Secondary | ICD-10-CM

## 2013-09-19 HISTORY — DX: Other specified postprocedural states: R11.2

## 2013-09-19 HISTORY — PX: LAPAROSCOPY: SHX197

## 2013-09-19 HISTORY — DX: Other specified postprocedural states: Z98.890

## 2013-09-19 LAB — CBC
HCT: 35 % — ABNORMAL LOW (ref 36.0–46.0)
HEMOGLOBIN: 11.9 g/dL — AB (ref 12.0–15.0)
MCH: 31.1 pg (ref 26.0–34.0)
MCHC: 34 g/dL (ref 30.0–36.0)
MCV: 91.4 fL (ref 78.0–100.0)
Platelets: 194 10*3/uL (ref 150–400)
RBC: 3.83 MIL/uL — ABNORMAL LOW (ref 3.87–5.11)
RDW: 12.6 % (ref 11.5–15.5)
WBC: 6 10*3/uL (ref 4.0–10.5)

## 2013-09-19 SURGERY — LAPAROSCOPY OPERATIVE
Anesthesia: General | Site: Abdomen | Laterality: Bilateral

## 2013-09-19 MED ORDER — LIDOCAINE HCL (CARDIAC) 20 MG/ML IV SOLN
INTRAVENOUS | Status: AC
  Filled 2013-09-19: qty 5

## 2013-09-19 MED ORDER — FENTANYL CITRATE 0.05 MG/ML IJ SOLN
INTRAMUSCULAR | Status: AC
  Filled 2013-09-19: qty 5

## 2013-09-19 MED ORDER — GLYCOPYRROLATE 0.2 MG/ML IJ SOLN
INTRAMUSCULAR | Status: DC | PRN
  Administered 2013-09-19: .5 mg via INTRAVENOUS

## 2013-09-19 MED ORDER — PROPOFOL 10 MG/ML IV BOLUS
INTRAVENOUS | Status: DC | PRN
  Administered 2013-09-19: 180 mg via INTRAVENOUS

## 2013-09-19 MED ORDER — LACTATED RINGERS IV SOLN
INTRAVENOUS | Status: DC
  Administered 2013-09-19 (×2): via INTRAVENOUS

## 2013-09-19 MED ORDER — MIDAZOLAM HCL 2 MG/2ML IJ SOLN
INTRAMUSCULAR | Status: AC
  Filled 2013-09-19: qty 2

## 2013-09-19 MED ORDER — OXYCODONE-ACETAMINOPHEN 5-325 MG PO TABS
1.0000 | ORAL_TABLET | ORAL | Status: DC | PRN
  Administered 2013-09-19: 1 via ORAL

## 2013-09-19 MED ORDER — PROMETHAZINE HCL 25 MG/ML IJ SOLN: 6.2500 mg | INTRAMUSCULAR | Status: DC | PRN

## 2013-09-19 MED ORDER — NEOSTIGMINE METHYLSULFATE 10 MG/10ML IV SOLN
INTRAVENOUS | Status: AC
  Filled 2013-09-19: qty 1

## 2013-09-19 MED ORDER — OXYCODONE-ACETAMINOPHEN 5-325 MG PO TABS
ORAL_TABLET | ORAL | Status: AC
  Administered 2013-09-19: 1
  Filled 2013-09-19: qty 1

## 2013-09-19 MED ORDER — DEXAMETHASONE SODIUM PHOSPHATE 10 MG/ML IJ SOLN
INTRAMUSCULAR | Status: AC
  Filled 2013-09-19: qty 1

## 2013-09-19 MED ORDER — ONDANSETRON HCL 4 MG/2ML IJ SOLN
INTRAMUSCULAR | Status: DC | PRN
  Administered 2013-09-19: 4 mg via INTRAVENOUS

## 2013-09-19 MED ORDER — SCOPOLAMINE 1 MG/3DAYS TD PT72
MEDICATED_PATCH | TRANSDERMAL | Status: AC
  Administered 2013-09-19: 1 via TRANSDERMAL
  Filled 2013-09-19: qty 1

## 2013-09-19 MED ORDER — FENTANYL CITRATE 0.05 MG/ML IJ SOLN
INTRAMUSCULAR | Status: DC | PRN
  Administered 2013-09-19: 100 ug via INTRAVENOUS
  Administered 2013-09-19: 50 ug via INTRAVENOUS

## 2013-09-19 MED ORDER — LACTATED RINGERS IR SOLN
Status: DC | PRN
  Administered 2013-09-19: 1

## 2013-09-19 MED ORDER — MEPERIDINE HCL 25 MG/ML IJ SOLN: 6.2500 mg | INTRAMUSCULAR | Status: DC | PRN

## 2013-09-19 MED ORDER — KETOROLAC TROMETHAMINE 30 MG/ML IJ SOLN
INTRAMUSCULAR | Status: AC
  Filled 2013-09-19: qty 1

## 2013-09-19 MED ORDER — KETOROLAC TROMETHAMINE 30 MG/ML IJ SOLN
INTRAMUSCULAR | Status: DC | PRN
  Administered 2013-09-19: 30 mg via INTRAVENOUS

## 2013-09-19 MED ORDER — MIDAZOLAM HCL 2 MG/2ML IJ SOLN
INTRAMUSCULAR | Status: DC | PRN
  Administered 2013-09-19: 2 mg via INTRAVENOUS

## 2013-09-19 MED ORDER — METHYLENE BLUE 1 % INJ SOLN
INTRAMUSCULAR | Status: AC
  Filled 2013-09-19: qty 1

## 2013-09-19 MED ORDER — NEOSTIGMINE METHYLSULFATE 10 MG/10ML IV SOLN
INTRAVENOUS | Status: DC | PRN
  Administered 2013-09-19: 3 mg via INTRAVENOUS

## 2013-09-19 MED ORDER — ROCURONIUM BROMIDE 100 MG/10ML IV SOLN
INTRAVENOUS | Status: AC
  Filled 2013-09-19: qty 1

## 2013-09-19 MED ORDER — BUPIVACAINE HCL (PF) 0.25 % IJ SOLN
INTRAMUSCULAR | Status: DC | PRN
  Administered 2013-09-19: 7 mL

## 2013-09-19 MED ORDER — MIDAZOLAM HCL 2 MG/2ML IJ SOLN: 0.5000 mg | Freq: Once | INTRAMUSCULAR | Status: DC | PRN

## 2013-09-19 MED ORDER — ROCURONIUM BROMIDE 100 MG/10ML IV SOLN
INTRAVENOUS | Status: DC | PRN
  Administered 2013-09-19: 40 mg via INTRAVENOUS

## 2013-09-19 MED ORDER — OXYCODONE-ACETAMINOPHEN 5-325 MG PO TABS: 1.0000 | ORAL_TABLET | ORAL | Status: DC | PRN

## 2013-09-19 MED ORDER — ONDANSETRON HCL 4 MG/2ML IJ SOLN
INTRAMUSCULAR | Status: AC
  Filled 2013-09-19: qty 2

## 2013-09-19 MED ORDER — FENTANYL CITRATE 0.05 MG/ML IJ SOLN
INTRAMUSCULAR | Status: AC
  Administered 2013-09-19: 50 ug via INTRAVENOUS
  Filled 2013-09-19: qty 2

## 2013-09-19 MED ORDER — LIDOCAINE HCL (CARDIAC) 20 MG/ML IV SOLN
INTRAVENOUS | Status: DC | PRN
  Administered 2013-09-19: 80 mg via INTRAVENOUS

## 2013-09-19 MED ORDER — HEPARIN SODIUM (PORCINE) 5000 UNIT/ML IJ SOLN
INTRAMUSCULAR | Status: AC
  Filled 2013-09-19: qty 1

## 2013-09-19 MED ORDER — FENTANYL CITRATE 0.05 MG/ML IJ SOLN
25.0000 ug | INTRAMUSCULAR | Status: DC | PRN
  Administered 2013-09-19: 50 ug via INTRAVENOUS

## 2013-09-19 MED ORDER — KETOROLAC TROMETHAMINE 30 MG/ML IJ SOLN: 15.0000 mg | Freq: Once | INTRAMUSCULAR | Status: DC | PRN

## 2013-09-19 MED ORDER — PROPOFOL 10 MG/ML IV EMUL
INTRAVENOUS | Status: AC
  Filled 2013-09-19: qty 20

## 2013-09-19 MED ORDER — DEXAMETHASONE SODIUM PHOSPHATE 10 MG/ML IJ SOLN
INTRAMUSCULAR | Status: DC | PRN
  Administered 2013-09-19: 10 mg via INTRAVENOUS

## 2013-09-19 MED ORDER — BUPIVACAINE HCL (PF) 0.25 % IJ SOLN
INTRAMUSCULAR | Status: AC
  Filled 2013-09-19: qty 30

## 2013-09-19 MED ORDER — GLYCOPYRROLATE 0.2 MG/ML IJ SOLN
INTRAMUSCULAR | Status: AC
  Filled 2013-09-19: qty 3

## 2013-09-19 SURGICAL SUPPLY — 28 items
ADH SKN CLS APL DERMABOND .7 (GAUZE/BANDAGES/DRESSINGS)
BAG SPEC RTRVL LRG 6X4 10 (ENDOMECHANICALS) ×1
BARRIER ADHS 3X4 INTERCEED (GAUZE/BANDAGES/DRESSINGS) IMPLANT
BLADE 15 SAFETY STRL DISP (BLADE) ×3 IMPLANT
BRR ADH 4X3 ABS CNTRL BYND (GAUZE/BANDAGES/DRESSINGS)
CABLE HIGH FREQUENCY MONO STRZ (ELECTRODE) IMPLANT
CATH ROBINSON RED A/P 16FR (CATHETERS) ×3 IMPLANT
CLOTH BEACON ORANGE TIMEOUT ST (SAFETY) ×3 IMPLANT
DERMABOND ADVANCED (GAUZE/BANDAGES/DRESSINGS)
DERMABOND ADVANCED .7 DNX12 (GAUZE/BANDAGES/DRESSINGS) IMPLANT
DRSG COVADERM PLUS 2X2 (GAUZE/BANDAGES/DRESSINGS) ×6 IMPLANT
DRSG OPSITE POSTOP 3X4 (GAUZE/BANDAGES/DRESSINGS) ×2 IMPLANT
FILTER SMOKE EVAC LAPAROSHD (FILTER) ×2 IMPLANT
GLOVE BIO SURGEON STRL SZ7.5 (GLOVE) ×3 IMPLANT
GOWN STRL REUS W/TWL LRG LVL3 (GOWN DISPOSABLE) ×6 IMPLANT
NS IRRIG 1000ML POUR BTL (IV SOLUTION) ×3 IMPLANT
PACK LAPAROSCOPY BASIN (CUSTOM PROCEDURE TRAY) ×3 IMPLANT
POUCH SPECIMEN RETRIEVAL 10MM (ENDOMECHANICALS) ×2 IMPLANT
PROTECTOR NERVE ULNAR (MISCELLANEOUS) ×5 IMPLANT
SET IRRIG TUBING LAPAROSCOPIC (IRRIGATION / IRRIGATOR) ×2 IMPLANT
SHEARS HARMONIC ACE PLUS 36CM (ENDOMECHANICALS) ×2 IMPLANT
SUT PLAIN 4 0 FS 2 27 (SUTURE) ×3 IMPLANT
SUT VICRYL 0 UR6 27IN ABS (SUTURE) ×3 IMPLANT
TOWEL OR 17X24 6PK STRL BLUE (TOWEL DISPOSABLE) ×6 IMPLANT
TROCAR XCEL NON-BLD 11X100MML (ENDOMECHANICALS) ×3 IMPLANT
TROCAR XCEL NON-BLD 5MMX100MML (ENDOMECHANICALS) ×5 IMPLANT
WARMER LAPAROSCOPE (MISCELLANEOUS) ×3 IMPLANT
WATER STERILE IRR 1000ML POUR (IV SOLUTION) ×3 IMPLANT

## 2013-09-19 NOTE — Anesthesia Postprocedure Evaluation (Signed)
Anesthesia Post Note  Patient: Mikayla Montes  Procedure(s) Performed: Procedure(s) (LRB): LAPAROSCOPY OPERATIVE WITH BILATERAL SALPINGO OOPHORECTOMY AND EXCISION OF PERITONEAL MASS (Bilateral)  Anesthesia type: GA  Patient location: PACU  Post pain: Pain level controlled  Post assessment: Post-op Vital signs reviewed  Last Vitals:  Filed Vitals:   09/19/13 0900  BP: 102/51  Pulse: 60  Temp:   Resp: 18    Post vital signs: Reviewed  Level of consciousness: sedated  Complications: No apparent anesthesia complications

## 2013-09-19 NOTE — Anesthesia Preprocedure Evaluation (Signed)
Anesthesia Evaluation  Patient identified by MRN, date of birth, ID band Patient awake    Reviewed: Allergy & Precautions, H&P , Patient's Chart, lab work & pertinent test results, reviewed documented beta blocker date and time   History of Anesthesia Complications (+) PONV and history of anesthetic complications  Airway Mallampati: II TM Distance: >3 FB Neck ROM: full    Dental   Pulmonary former smoker,  breath sounds clear to auscultation        Cardiovascular Exercise Tolerance: Good Rhythm:regular Rate:Normal     Neuro/Psych    GI/Hepatic GERD-  ,  Endo/Other    Renal/GU      Musculoskeletal   Abdominal   Peds  Hematology   Anesthesia Other Findings   Reproductive/Obstetrics                           Anesthesia Physical Anesthesia Plan  ASA: II  Anesthesia Plan: General ETT   Post-op Pain Management:    Induction:   Airway Management Planned:   Additional Equipment:   Intra-op Plan:   Post-operative Plan:   Informed Consent: I have reviewed the patients History and Physical, chart, labs and discussed the procedure including the risks, benefits and alternatives for the proposed anesthesia with the patient or authorized representative who has indicated his/her understanding and acceptance.   Dental Advisory Given  Plan Discussed with: CRNA and Surgeon  Anesthesia Plan Comments:         Anesthesia Quick Evaluation

## 2013-09-19 NOTE — Discharge Instructions (Signed)
Postoperative Instructions Laparoscopy  Dr. Phineas Real and the nursing staff have discussed postoperative instructions with you.  If you have any questions please ask them before you leave the hospital, or call Dr Elisabeth Most office at [phone].    We would like to emphasize the following instructions:     Call the office to make your follow-up appointment as recommended by Dr Phineas Real (usually 1-2 weeks).    You were given a prescription, or one was ordered for you at the pharmacy you designated.  Get that prescription filled and take the medication according to instructions.    You may eat a regular diet, but slowly until you start having bowel movements.    Drink plenty of water daily.    Nothing in the vagina (intercourse, douching, objects of any kind) for 2 weeks.  When reinitiating intercourse, if it is uncomfortable, stop and make an appointment with Dr Phineas Real to be evaluated.    No driving for several days until the anesthesia has worn off and you are not having significant pain.  Car rides (short) are ok, as long as you are not having significant pain, but no traveling out of town until your postoperative appointment.    You may shower, but no baths for two weeks.  Walking up and down stairs is ok.  No heavy lifting, prolonged standing, repeated bending or any working out until your first  postoperative appointment.    Rest frequently, listen to your body and do not push yourself and overdo it.    Call if:  o Your pain medication does not seem strong enough. o Worsening pain or abdominal bloating o Persistent nausea or vomiting o Difficulty with urination or bowel movements. o Temperature of 101 degrees or higher. o Heavy vaginal bleeding.  If your period is due, you may use tampons.   o Incisions become red, tender or begin to drain. o You have any questions or concerns  DISCHARGE INSTRUCTIONS: Laparoscopy  The following instructions have been prepared to help you  care for yourself upon your return home today.  No Ibuprofen containing products (ie Advil, Aleve, Motrin, etc.) until after 2:15 pm today.  Wound care:  Do not get the incision wet for the first 24 hours. The incision should be kept clean and dry.  The Band-Aids or dressings may be removed the day after surgery.  Should the incision become sore, red, and swollen after the first week, check with your doctor.  Personal hygiene:  Shower the day after your procedure.  Activity and limitations:  Do NOT drive or operate any equipment today.  Do NOT lift anything more than 15 pounds for 2-3 weeks after surgery.  Do NOT rest in bed all day.  Walking is encouraged. Walk each day, starting slowly with 5-minute walks 3 or 4 times a day. Slowly increase the length of your walks.  Walk up and down stairs slowly.  Do NOT do strenuous activities, such as golfing, playing tennis, bowling, running, biking, weight lifting, gardening, mowing, or vacuuming for 2-4 weeks. Ask your doctor when it is okay to start.  Diet: Eat a light meal as desired this evening. You may resume your usual diet tomorrow.  Return to work: This is dependent on the type of work you do. For the most part you can return to a desk job within a week of surgery. If you are more active at work, please discuss this with your doctor.  What to expect after your surgery: You may  have a slight burning sensation when you urinate on the first day. You may have a very small amount of blood in the urine. Expect to have a small amount of vaginal discharge/light bleeding for 1-2 weeks. It is not unusual to have abdominal soreness and bruising for up to 2 weeks. You may be tired and need more rest for about 1 week. You may experience shoulder pain for 24-72 hours. Lying flat in bed may relieve it.  Call your doctor for any of the following:  Inability to urinate 6 hours after discharge from hospital  Severe pain not relieved by pain  medications  Persistent of heavy bleeding at incision site  Redness or swelling around incision site after a week  Increasing nausea or vomiting  Patient Signature________________________________________ Nurse Signature_________________________________________

## 2013-09-19 NOTE — Op Note (Signed)
Mikayla Montes [birth year] [id number]   Post Operative Note   Date of surgery:  09/19/2013  Pre Op Dx:  Pelvic pain, history of endometriosis  Post Op Dx:  Pelvic pain, history of endometriosis, right paratubal cyst, right round ligament mass  Procedure:  Laparoscopic bilateral salpingo-oophorectomy, excision right round ligament mass  Surgeon:  Anastasio Auerbach  Assistant:  Uvaldo Rising  Anesthesia:  General  EBL:  Minimal  Complications:  None  Specimen:  #1 Right ovary with attached fallopian tube segment #2 Left ovary with attached fallopian tube segment. #3 Right round ligament mass to pathology  Findings: EUA:  External BUS vagina normal. Bimanual without masses   Operative:  Anterior cul-de-sac normal. Posterior cul-de-sac normal. Vaginal cuff normal. Right fallopian tube and ovary grossly normal free and mobile with small peritubal cyst. Left fallopian tube and ovary grossly normal free and mobile. 2 cm firm mass at the distal end of the right round ligament. No evidence of active pelvic endometriosis or significant adhesive disease. Upper abdominal exam is normal noting appendix normal free and mobile. Liver smooth with no abnormalities. Gallbladder not clearly visualized. No upper abdominal adhesive disease or other pathology noted.  Procedure:  The patient was taken to the operating room, underwent general anesthesia, was placed in the low dorsal lithotomy position, received an abdominal, perineal and vaginal preparation with Betadine solution, and EUA performed and a Foley catheter was placed in sterile technique. The time out was performed by the surgical team. A sponge stick was placed within the vagina and the patient was draped in the usual fashion. An NG tube was placed to empty the stomach and a small incision was made in the left upper quadrant, mid clavicular line, 3 fingerbreadths below the costal margin and after transillumination for the vessels, using the  Optiview 5 mm direct entry trocar, the abdomen was directed entered under direct visualization without difficulty. The abdomen was then insufflated and examination of the anterior abdominal wall showed no significant adhesions from her prior surgeries. A transverse repeat infraumbilical incision was then made and using the 10 mm Optiview direct entry trocar the abdomen was directly entered under direct visualization without difficulty. A 5 mm right suprapubic port was then placed after transillumination for the vessels under direct visualization without difficulty. Examination of the pelvic organs and upper abdominal exam was carried out with findings noted above. Both ureters were identified and traced along their courses along the pelvic sidewalls and found to be away from the operative fields. The left ovary was then elevated and using the harmonic scalpel the left infundibulopelvic ligament and vessels were transected without difficulty. The ovary and fallopian tube segment were then freed from their peritoneal attachments using the harmonic scalpel without difficulty and placed in the cul-de-sac for future retrieval. A similar procedure was then carried out on the right. Attention was then turned to the firm right round ligament mass felt probably to be a fibroma or possibly fibroadenoma and this mass was elevated, again identifying the ureter away from the site and the peritoneum was incised lateral to this mass and through blunt and sharp dissection using the harmonic scalpel, the mass was freed from the pelvic attachments. The right round ligament was then transected at the junction with the mass and the mass was freed. This also was placed in the posterior cul-de-sac for future retrieval. A 5 mm laparoscope was then placed suprapubically and an Endopouch was placed through the 10 mm port and all the specimens  were placed within the pouch. The specimens were then retrieved through the infraumbilical incision  without difficulty and sent to pathology. The abdomen was then reinsufflated, copiously irrigated and adequate hemostasis was visualized at all surgical sites. The gas was allowed to escape and again the surgical sites inspected under low pressure situation showing adequate hemostasis. The right and left suprapubic ports were then removed under direct visualization showing adequate hemostasis and the infraumbilical port was backed out under direct visualization showing adequate hemostasis and no evidence of hernia formation. All skin incisions were injected using 0.25% Marcaine, a total of 15 cc. The infraumbilical port was closed using 0 Vicryl suture in an interrupted subcutaneous fascial stitch. The infraumbilical skin was reapproximated using 4-0 plain suture in interrupted cuticular stitch and the suprapubic ports were reapproximated using Dermabond skin adhesive. The vaginal sponge was removed, the patient received intraoperative Toradol, the patient awakened without difficulty and taken to the recovery room in good condition having tolerated the procedure well.    Note: This document was prepared with digital dictation and possible smart phrase technology. Any transcriptional errors that result from this process are unintentional.  Anastasio Auerbach MD, 9:03 AM 09/19/2013

## 2013-09-19 NOTE — Transfer of Care (Signed)
Immediate Anesthesia Transfer of Care Note  Patient: Mikayla Montes  Procedure(s) Performed: Procedure(s): LAPAROSCOPY OPERATIVE WITH BILATERAL SALPINGO OOPHORECTOMY AND EXCISION OF PERITONEAL MASS (Bilateral)  Patient Location: PACU  Anesthesia Type:General  Level of Consciousness: awake, alert  and oriented  Airway & Oxygen Therapy: Patient Spontanous Breathing and Patient connected to nasal cannula oxygen  Post-op Assessment: Report given to PACU RN, Post -op Vital signs reviewed and stable and Patient moving all extremities  Post vital signs: Reviewed and stable  Complications: No apparent anesthesia complications

## 2013-09-19 NOTE — H&P (Signed)
  The patient was examined.  I reviewed the proposed surgery and consent form with the patient.  She again confirms we are removing both ovaries and tubes.  The dictated history and physical is current and accurate and all questions were answered. The patient is ready to proceed with surgery and has a realistic understanding and expectation for the outcome.   Anastasio Auerbach MD, 6:57 AM 09/19/2013

## 2013-09-22 ENCOUNTER — Encounter (HOSPITAL_COMMUNITY): Payer: Self-pay | Admitting: Gynecology

## 2013-09-23 ENCOUNTER — Telehealth: Payer: Self-pay | Admitting: *Deleted

## 2013-09-23 MED ORDER — AMITRIPTYLINE HCL 25 MG PO TABS: 25.0000 mg | ORAL_TABLET | Freq: Every day | ORAL | Status: AC

## 2013-09-23 NOTE — Telephone Encounter (Signed)
Pt is post laparoscopy with bilateral salpingo-oophorectomy on 09/19/13 doing well.  1.Pt asked if normal after surgery to have some vaginal irritation? no bleeding, no discharge vaginally just irritation related to burning at times. 2. She also asked if you would be willing to refill her amitriptyline 25 mg?  3. Pt scheduled for 2 week post op appointment on 10/03/13, she asked if she could be seen sooner? Works from home.

## 2013-09-23 NOTE — Telephone Encounter (Signed)
Pt informed with the below note, rx sent. 

## 2013-09-23 NOTE — Telephone Encounter (Signed)
Okay to refill amitriptyline 25 mg #30 one by mouth each bedtime as needed refill x1. Okay for postop appointment sooner. Vaginal irritation not unusual from the Betadine cleaning preoperatively.

## 2013-09-25 ENCOUNTER — Other Ambulatory Visit: Payer: Self-pay | Admitting: Gynecology

## 2013-09-25 ENCOUNTER — Telehealth: Payer: Self-pay

## 2013-09-25 DIAGNOSIS — Z0289 Encounter for other administrative examinations: Secondary | ICD-10-CM

## 2013-09-25 MED ORDER — TERCONAZOLE 0.4 % VA CREA: TOPICAL_CREAM | VAGINAL | Status: DC

## 2013-09-25 NOTE — Telephone Encounter (Signed)
Trial of Terazol 7 day cream external and internal. I would try to avoid using the Percocet for vaginal burning.

## 2013-09-25 NOTE — Telephone Encounter (Signed)
Patient is miserable with external vulvar burning.  She said the whole area has turned white.  She said no itching.  She said she feels fine otherwise but is taking the Percocet because of the vulvar burning. She said it is the only thing keeping her feeling bad.  She asked if anything she can do for some relief? (She called a few days ago and you suspected irritation from the Betadine wash. )

## 2013-09-25 NOTE — Telephone Encounter (Signed)
Patient advised. Rx sent. 

## 2013-10-02 ENCOUNTER — Encounter: Payer: Self-pay | Admitting: Gynecology

## 2013-10-02 ENCOUNTER — Ambulatory Visit (INDEPENDENT_AMBULATORY_CARE_PROVIDER_SITE_OTHER): Payer: 59 | Admitting: Gynecology

## 2013-10-02 DIAGNOSIS — N898 Other specified noninflammatory disorders of vagina: Secondary | ICD-10-CM

## 2013-10-02 DIAGNOSIS — Z9889 Other specified postprocedural states: Secondary | ICD-10-CM

## 2013-10-02 LAB — WET PREP FOR TRICH, YEAST, CLUE
CLUE CELLS WET PREP: NONE SEEN
Trich, Wet Prep: NONE SEEN
Yeast Wet Prep HPF POC: NONE SEEN

## 2013-10-02 NOTE — Addendum Note (Signed)
Addended by: Nelva Nay on: 10/02/2013 04:12 PM   Modules accepted: Orders

## 2013-10-02 NOTE — Progress Notes (Signed)
Mikayla Montes Feb 16, 1961 [id number]        53 y.o.  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 She reports feeling dramatically better from her vaginal pain. Did call last week with some discharge and was treated with Terazol cream but used it twice and then stopped. Said that she's having a little bit of irritation vaginally but overall is doing well.  Past medical history,surgical history, problem list, medications, allergies, family history and social history were all reviewed and documented in the EPIC chart.  Directed ROS with pertinent positives and negatives documented in the history of present illness/assessment and plan.  Exam: Mikayla Montes General appearance:  Normal Abdomen: Soft nontender without masses guarding rebound. Incisions healing nicely. Several sutures remain in the infraumbilical incision and I removed them. Pelvic: External BUS vagina with slight white discharge. Bimanual without masses or tenderness  Assessment/Plan:  53 y.o. G1P0 with normal postoperative visit. Results of surgery reviewed with the patient. Has done well as far as relieving her pain. Slight discharge wet prep is negative. Recommend doing nothing at this point and observation. She does have an appointment to see me for her annual exam in August. I did ask her to avoid using any vaginal cream specifically Premarin and just to rest the area and see if her slight irritation doesn't resolve on its own.   Note: This document was prepared with digital dictation and possible smart phrase technology. Any transcriptional errors that result from this process are unintentional.   Mikayla Auerbach MD, 2:45 PM 10/02/2013

## 2013-10-02 NOTE — Patient Instructions (Signed)
Followup in August when you are due for your annual exam.

## 2013-10-03 ENCOUNTER — Ambulatory Visit: Payer: 59 | Admitting: Gynecology

## 2013-10-16 ENCOUNTER — Telehealth: Payer: Self-pay

## 2013-10-16 ENCOUNTER — Other Ambulatory Visit: Payer: Self-pay | Admitting: Gynecology

## 2013-10-16 MED ORDER — FLUCONAZOLE 200 MG PO TABS: 200.0000 mg | ORAL_TABLET | Freq: Every day | ORAL | Status: DC

## 2013-10-16 NOTE — Telephone Encounter (Signed)
Patient advised. Rx sent. 

## 2013-10-16 NOTE — Telephone Encounter (Signed)
This sounds like the same type of pain she was having preoperative.  I do not have a good explanation for it. She had been using higher dose vaginal estrogen cream by another physician which I do not think is helpful given that she is on oral estrogen. We can try an arbitrary treatment for yeast with Diflucan 200 mg daily x5 days to try to eradicate any cutaneous yeast. If her symptoms persist then my recommendation would be to refer her to a GYN pain/vaginitis clinic like that [facility] which I had offered her preoperatively.

## 2013-10-16 NOTE — Telephone Encounter (Signed)
Patient had surgery 09/19/13.  C/O vaginal burning at times burns all the way back to her rectum.  She said she mentioned to you at last visit that it was off and on. She said it is worse with sitting. No vaginal discharge. No itching. No UTI sx.    I told her you would probably recommend office visit but she insisted I check with you.

## 2013-10-16 NOTE — Telephone Encounter (Signed)
error 

## 2013-11-10 ENCOUNTER — Telehealth: Payer: Self-pay

## 2013-11-10 NOTE — Telephone Encounter (Signed)
Patient said she had hysterectomy a couple of weeks ago and is having severe vaginal dryness to the point the skin is cracking.  She said she is taking oral estrogen but wondered if she should start back on her est vaginal cream or what you might recommend she do for this.

## 2013-11-10 NOTE — Telephone Encounter (Signed)
She had a laparoscopic bilateral salpingo-oophorectomy. Okay to reinitiate Estrace vaginal cream 2-3 times weekly

## 2013-11-10 NOTE — Telephone Encounter (Signed)
Patient informed. 

## 2013-11-10 NOTE — Telephone Encounter (Signed)
Patient again stated she had a "hysterectomy" in the second phone conversation so I clarified type of surgery she had with her to be sure she understood.  She said she did know the it has BSO she had hysterectomy 15 years ago and not sure why she kept saying it.

## 2013-11-13 ENCOUNTER — Encounter: Payer: 59 | Admitting: Gynecology

## 2013-11-17 ENCOUNTER — Other Ambulatory Visit: Payer: Self-pay | Admitting: Urology

## 2013-11-18 ENCOUNTER — Encounter (HOSPITAL_BASED_OUTPATIENT_CLINIC_OR_DEPARTMENT_OTHER): Payer: Self-pay | Admitting: *Deleted

## 2013-11-18 NOTE — Progress Notes (Signed)
NPO AFTER MN.  ARRIVE AT 0945.  NEEDS HG.  

## 2013-11-24 NOTE — H&P (Signed)
History of Present Illness   I was consulted by Dr Dory Horn regarding Mikayla Montes's vaginal burning that has been present daily for approximately 10 weeks. It is severe and it feels like a hot iron. It is daily. She says it is difficult to wear some clothes. She has dyspareunia. She has tried Estrace and other treatments and she finds them irritating. She has had Toradol and Percocet.  In 1999, she had the same problem and was treated with treatment, including Mytrex cream, Estrace, Diflucan, avoidance of irritating laundry detergent, amitriptyline, metronidazole, Estring ring.   She said it feels similar. She feels dry. She thinks she might be able to tolerate Premarin.  She has no neurologic risk factors or symptoms. She has had hysterectomy. Her bowel function is normal.  Her oral estrogen and Elavil was increased. She is having less hot flashes, but the burning is still present. Boric acid has not been effective.  There is no other modifying factors or associated signs or symptoms. There is no other aggravating or relieving factors. The presentation is moderate to severe in severity and persisting.    Past Medical History Problems  1. History of Anxiety (300.00) 2. History of asthma (V12.69) 3. History of depression (V11.8) 4. History of esophageal reflux (V12.79) 5. History of Multiple allergies (V15.09)  Surgical History Problems  1. History of Cesarean Section 2. History of Exploratory Laparotomy 3. History of Hysterectomy 4. History of Tubal Ligation  Current Meds 1. Elavil TABS;  Therapy: (Recorded:20May2015) to Recorded 2. Estradiol TABS;  Therapy: (Recorded:20May2015) to Recorded 3. Percocet TABS;  Therapy: (Recorded:20May2015) to Recorded 4. Serzone 150 MG TABS;  Therapy: (Recorded:20May2015) to Recorded 5. Uribel 118 MG Oral Capsule;  Therapy: (Recorded:20May2015) to Recorded 6. Xanax TABS;  Therapy: (Recorded:20May2015) to Recorded  Allergies Medication   1. Penicillins 2. Sulfa Drugs  Family History Problems  1. Family history of hypertension (V17.49) : Mother, Father 2. Family history of kidney stones (V18.69) : Maternal Grandmother  Social History Problems    Alcohol use (V49.89)   socially   Caffeine use (V49.89)   2 cups of coffee   Divorced   Former smoker [place])   smoked for 15 yearsquit smoking 15 years ago   Number of children   1 son   Occupation   Press photographer for [employer]  Review of Systems Constitutional, skin, eye, otolaryngeal, hematologic/lymphatic, cardiovascular, pulmonary, endocrine, musculoskeletal, gastrointestinal and neurological system(s) were reviewed and pertinent findings if present are noted.  Genitourinary: dyspareunia.  Psychiatric: depression and anxiety.    Vitals Vital Signs [Data Includes: Last 1 Day]  Recorded: [id number] 01:47PM  Height: 5 ft 2 in Weight: 114 lb  BMI Calculated: 20.85 BSA Calculated: 1.51 Blood Pressure: 124 / 77 Temperature: 98.2 F Heart Rate: 46  Physical Exam Constitutional: Well nourished and well developed . No acute distress.  ENT:. The ears and nose are normal in appearance.  Neck: The appearance of the neck is normal and no neck mass is present.  Pulmonary: No respiratory distress and normal respiratory rhythm and effort.  Cardiovascular: Heart rate and rhythm are normal . No peripheral edema.  Lymphatics: The femoral and inguinal nodes are not enlarged or tender.  Skin: Normal skin turgor, no visible rash and no visible skin lesions.  Neuro/Psych:. Mood and affect are appropriate.  . Abdomen:   No mass or tenderness.  . Genitourinary:    No prolapse. No diverticulum. No obvious vaginitis. Levator muscles nontender. Bladder nontender. Tissues look reasonably moist.  Results/Data    Today Mikayla Montes underwent a number of tests I personally reviewed.   Urinalysis was negative.  Bladder scan: Bladder scan residual was 18  mL. Urine [Data Includes: Last 1 Day]   [id number]  COLOR YELLOW   APPEARANCE CLEAR   SPECIFIC GRAVITY 1.010   pH 7.0   GLUCOSE NEG mg/dL  BILIRUBIN NEG   KETONE NEG mg/dL  BLOOD NEG   PROTEIN NEG mg/dL  UROBILINOGEN 0.2 mg/dL  NITRITE NEG   LEUKOCYTE ESTERASE NEG    Assessment Assessed  1. Vaginal pain (625.9) 2. Dyspareunia, female (625.0) 3. Increased urinary frequency (788.41)  Plan Dyspareunia, female  1. Start: Oxycodone-Acetaminophen 10-325 MG Oral Tablet (Percocet); TAKE 1 TO 2  TABLETS EVERY 6 HOURS AS NEEDED FOR PAIN 2. Start: Premarin 0.625 MG/GM Vaginal Cream; 1 gm 3x/week x 4 weeks, then 1x per week  Discussion/Summary   Mikayla Montes has a lot of vaginal burning. She had similar presentation in 1999 that was difficult to treat and it was also documented that she is very sensitive to a number of medications and treatments. The question by Dr Nori Riis is whether or not she could have interstitial cystitis. She was urinating rather frequently day and night, but since taking Uribel, she now voids every 2 hours and gets up once a night. She was having urgency, which is also settling down. She refers to them as spasms.   Mikayla Montes has no abdominal pain. She says it just burns inside the vagina. Role of trial of Premarin discussed. Role of hydrodistention discussed.  We talked about cystoscopy/hydrodistension and instillation in detail. Pros, cons, general surgical and anesthetic risks, and other options including watchful waiting were discussed. Risks were described but not limited to pain, infection, and bleeding. The risk of bladder perforation and management were discussed. The patient understands that it is primarily a diagnostic procedure.   Mikayla Montes would like to try Premarin cream. Samples and prescription given. Twenty Percocet given. She understands I will not refill her pain medicine. She will call us if she wishes to proceed with hydrodistention.  I am going  to send a copy of my note to Dr Nori Riis to keep him updated on the patient's treatment course.  Mikayla Montes spoke about compounding estrogen that I do not how prescribe and she can follow up with Dr Nori Riis in this regard.   After a thorough review of the management options for the patient's condition the patient  elected to proceed with surgical therapy as noted above. We have discussed the potential benefits and risks of the procedure, side effects of the proposed treatment, the likelihood of the patient achieving the goals of the procedure, and any potential problems that might occur during the procedure or recuperation. Informed consent has been obtained.

## 2013-11-25 ENCOUNTER — Encounter (HOSPITAL_BASED_OUTPATIENT_CLINIC_OR_DEPARTMENT_OTHER)
Admission: RE | Disposition: A | Discharge status: Home / Self Care | Payer: Self-pay | Source: Ambulatory Visit | Attending: Urology

## 2013-11-25 ENCOUNTER — Ambulatory Visit (HOSPITAL_BASED_OUTPATIENT_CLINIC_OR_DEPARTMENT_OTHER): Payer: 59 | Admitting: Anesthesiology

## 2013-11-25 ENCOUNTER — Encounter (HOSPITAL_BASED_OUTPATIENT_CLINIC_OR_DEPARTMENT_OTHER): Payer: Self-pay | Admitting: *Deleted

## 2013-11-25 ENCOUNTER — Ambulatory Visit (HOSPITAL_BASED_OUTPATIENT_CLINIC_OR_DEPARTMENT_OTHER)
Admission: RE | Admit: 2013-11-25 | Discharge: 2013-11-25 | Disposition: A | Discharge status: Home / Self Care | Payer: 59 | Source: Ambulatory Visit | Attending: Urology | Admitting: Urology

## 2013-11-25 ENCOUNTER — Encounter (HOSPITAL_BASED_OUTPATIENT_CLINIC_OR_DEPARTMENT_OTHER): Payer: 59 | Admitting: Anesthesiology

## 2013-11-25 DIAGNOSIS — Z90710 Acquired absence of both cervix and uterus: Secondary | ICD-10-CM | POA: Diagnosis not present

## 2013-11-25 DIAGNOSIS — K219 Gastro-esophageal reflux disease without esophagitis: Secondary | ICD-10-CM | POA: Diagnosis not present

## 2013-11-25 DIAGNOSIS — Z882 Allergy status to sulfonamides status: Secondary | ICD-10-CM | POA: Insufficient documentation

## 2013-11-25 DIAGNOSIS — F411 Generalized anxiety disorder: Secondary | ICD-10-CM | POA: Insufficient documentation

## 2013-11-25 DIAGNOSIS — N949 Unspecified condition associated with female genital organs and menstrual cycle: Secondary | ICD-10-CM | POA: Insufficient documentation

## 2013-11-25 DIAGNOSIS — F329 Major depressive disorder, single episode, unspecified: Secondary | ICD-10-CM | POA: Insufficient documentation

## 2013-11-25 DIAGNOSIS — Z880 Allergy status to penicillin: Secondary | ICD-10-CM | POA: Insufficient documentation

## 2013-11-25 DIAGNOSIS — F3289 Other specified depressive episodes: Secondary | ICD-10-CM | POA: Diagnosis not present

## 2013-11-25 DIAGNOSIS — J45909 Unspecified asthma, uncomplicated: Secondary | ICD-10-CM | POA: Diagnosis not present

## 2013-11-25 HISTORY — DX: Depression, unspecified: F32.A

## 2013-11-25 HISTORY — DX: Lactose intolerance, unspecified: E73.9

## 2013-11-25 HISTORY — DX: Major depressive disorder, single episode, unspecified: F32.9

## 2013-11-25 HISTORY — DX: Anxiety disorder, unspecified: F41.9

## 2013-11-25 HISTORY — DX: Endometriosis, unspecified: N80.9

## 2013-11-25 HISTORY — DX: Other asthma: J45.998

## 2013-11-25 HISTORY — DX: Pelvic and perineal pain: R10.2

## 2013-11-25 HISTORY — DX: Reserved for concepts with insufficient information to code with codable children: IMO0002

## 2013-11-25 HISTORY — PX: CYSTOSCOPY WITH HYDRODISTENSION AND BIOPSY: SHX5127

## 2013-11-25 HISTORY — DX: Frequency of micturition: R35.0

## 2013-11-25 HISTORY — DX: Presence of spectacles and contact lenses: Z97.3

## 2013-11-25 LAB — POCT HEMOGLOBIN-HEMACUE: Hemoglobin: 11.5 g/dL — ABNORMAL LOW (ref 12.0–15.0)

## 2013-11-25 SURGERY — CYSTOSCOPY, WITH BLADDER HYDRODISTENSION AND BIOPSY
Anesthesia: General | Site: Bladder

## 2013-11-25 MED ORDER — STERILE WATER FOR IRRIGATION IR SOLN
Status: DC | PRN
  Administered 2013-11-25: 3000 mL via INTRAVESICAL

## 2013-11-25 MED ORDER — HYDROCODONE-ACETAMINOPHEN 5-325 MG PO TABS
ORAL_TABLET | ORAL | Status: AC
  Filled 2013-11-25: qty 1

## 2013-11-25 MED ORDER — LACTATED RINGERS IV SOLN
INTRAVENOUS | Status: DC
  Administered 2013-11-25: 10:00:00 via INTRAVENOUS
  Filled 2013-11-25: qty 1000

## 2013-11-25 MED ORDER — LIDOCAINE HCL (CARDIAC) 20 MG/ML IV SOLN
INTRAVENOUS | Status: DC | PRN
  Administered 2013-11-25: 80 mg via INTRAVENOUS

## 2013-11-25 MED ORDER — CIPROFLOXACIN HCL 250 MG PO TABS: 250.0000 mg | ORAL_TABLET | Freq: Two times a day (BID) | ORAL | Status: DC

## 2013-11-25 MED ORDER — MIDAZOLAM HCL 5 MG/5ML IJ SOLN
INTRAMUSCULAR | Status: DC | PRN
  Administered 2013-11-25: 2 mg via INTRAVENOUS

## 2013-11-25 MED ORDER — FENTANYL CITRATE 0.05 MG/ML IJ SOLN
25.0000 ug | INTRAMUSCULAR | Status: DC | PRN
  Filled 2013-11-25: qty 1

## 2013-11-25 MED ORDER — CIPROFLOXACIN IN D5W 400 MG/200ML IV SOLN
400.0000 mg | INTRAVENOUS | Status: AC
  Administered 2013-11-25: 400 mg via INTRAVENOUS
  Filled 2013-11-25: qty 200

## 2013-11-25 MED ORDER — NALOXONE HCL 0.4 MG/ML IJ SOLN
INTRAMUSCULAR | Status: DC | PRN
  Administered 2013-11-25: .2 mg via INTRAVENOUS

## 2013-11-25 MED ORDER — DEXAMETHASONE SODIUM PHOSPHATE 4 MG/ML IJ SOLN
INTRAMUSCULAR | Status: DC | PRN
  Administered 2013-11-25: 10 mg via INTRAVENOUS

## 2013-11-25 MED ORDER — HYDROCODONE-ACETAMINOPHEN 5-325 MG PO TABS: 1.0000 | ORAL_TABLET | Freq: Four times a day (QID) | ORAL | Status: DC | PRN

## 2013-11-25 MED ORDER — CIPROFLOXACIN IN D5W 400 MG/200ML IV SOLN
INTRAVENOUS | Status: AC
  Filled 2013-11-25: qty 200

## 2013-11-25 MED ORDER — PHENAZOPYRIDINE HCL 200 MG PO TABS
ORAL | Status: DC | PRN
  Administered 2013-11-25: 12:00:00 via INTRAVESICAL

## 2013-11-25 MED ORDER — FENTANYL CITRATE 0.05 MG/ML IJ SOLN
INTRAMUSCULAR | Status: AC
  Filled 2013-11-25: qty 4

## 2013-11-25 MED ORDER — ONDANSETRON HCL 4 MG/2ML IJ SOLN
INTRAMUSCULAR | Status: DC | PRN
  Administered 2013-11-25: 4 mg via INTRAVENOUS

## 2013-11-25 MED ORDER — PROPOFOL 10 MG/ML IV BOLUS
INTRAVENOUS | Status: DC | PRN
  Administered 2013-11-25: 200 mg via INTRAVENOUS

## 2013-11-25 MED ORDER — FENTANYL CITRATE 0.05 MG/ML IJ SOLN
INTRAMUSCULAR | Status: DC | PRN
  Administered 2013-11-25 (×2): 50 ug via INTRAVENOUS

## 2013-11-25 MED ORDER — HYDROCODONE-ACETAMINOPHEN 5-325 MG PO TABS
1.0000 | ORAL_TABLET | Freq: Four times a day (QID) | ORAL | Status: DC | PRN
  Administered 2013-11-25: 1 via ORAL
  Filled 2013-11-25: qty 1

## 2013-11-25 MED ORDER — MEPERIDINE HCL 25 MG/ML IJ SOLN
6.2500 mg | INTRAMUSCULAR | Status: DC | PRN
  Filled 2013-11-25: qty 1

## 2013-11-25 MED ORDER — PROMETHAZINE HCL 25 MG/ML IJ SOLN
6.2500 mg | INTRAMUSCULAR | Status: DC | PRN
  Filled 2013-11-25: qty 1

## 2013-11-25 MED ORDER — MIDAZOLAM HCL 2 MG/2ML IJ SOLN
INTRAMUSCULAR | Status: AC
  Filled 2013-11-25: qty 2

## 2013-11-25 MED ORDER — LACTATED RINGERS IV SOLN
INTRAVENOUS | Status: DC
  Filled 2013-11-25: qty 1000

## 2013-11-25 SURGICAL SUPPLY — 22 items
BAG DRAIN URO-CYSTO SKYTR STRL (DRAIN) ×3 IMPLANT
BAG DRN UROCATH (DRAIN) ×1
CANISTER SUCT LVC 12 LTR MEDI- (MISCELLANEOUS) ×2 IMPLANT
CATH FOLEY 2WAY SLVR  5CC 18FR (CATHETERS)
CATH FOLEY 2WAY SLVR 5CC 18FR (CATHETERS) IMPLANT
CATH ROBINSON RED A/P 12FR (CATHETERS) IMPLANT
CATH ROBINSON RED A/P 14FR (CATHETERS) ×2 IMPLANT
CLOTH BEACON ORANGE TIMEOUT ST (SAFETY) ×3 IMPLANT
DRAPE CAMERA CLOSED 9X96 (DRAPES) ×3 IMPLANT
ELECT REM PT RETURN 9FT ADLT (ELECTROSURGICAL)
ELECTRODE REM PT RTRN 9FT ADLT (ELECTROSURGICAL) IMPLANT
GLOVE BIO SURGEON STRL SZ7.5 (GLOVE) ×5 IMPLANT
GLOVE INDICATOR 7.0 STRL GRN (GLOVE) ×2 IMPLANT
GLOVE SURG SS PI 7.5 STRL IVOR (GLOVE) ×2 IMPLANT
GOWN STRL REUS W/ TWL LRG LVL3 (GOWN DISPOSABLE) IMPLANT
GOWN STRL REUS W/TWL LRG LVL3 (GOWN DISPOSABLE) ×9
NDL SAFETY ECLIPSE 18X1.5 (NEEDLE) ×1 IMPLANT
NEEDLE HYPO 18GX1.5 SHARP (NEEDLE) ×3
PACK CYSTOSCOPY (CUSTOM PROCEDURE TRAY) ×3 IMPLANT
SUT SILK 0 TIES 10X30 (SUTURE) IMPLANT
SYR 20CC LL (SYRINGE) ×3 IMPLANT
WATER STERILE IRR 3000ML UROMA (IV SOLUTION) ×3 IMPLANT

## 2013-11-25 NOTE — Interval H&P Note (Signed)
History and Physical Interval Note:  11/25/2013 7:17 AM  Mikayla Montes  has presented today for surgery, with the diagnosis of PELVIC PAIN   The various methods of treatment have been discussed with the patient and family. After consideration of risks, benefits and other options for treatment, the patient has consented to  Procedure(s): CYSTOSCOPY/HYDRODISTENSION, INSTALLATION OF MARCAINE & PYRIDIUM      (N/A) as a surgical intervention .  The patient's history has been reviewed, patient examined, no change in status, stable for surgery.  I have reviewed the patient's chart and labs.  Questions were answered to the patient's satisfaction.     Zamarian Scarano A

## 2013-11-25 NOTE — Transfer of Care (Signed)
Immediate Anesthesia Transfer of Care Note  Patient: Mikayla Montes  Procedure(s) Performed: Procedure(s): CYSTOSCOPY/HYDRODISTENSION, INSTALLATION OF MARCAINE & PYRIDIUM      (N/A)  Patient Location: PACU  Anesthesia Type:General  Level of Consciousness: awake, alert , oriented and patient cooperative  Airway & Oxygen Therapy: Patient Spontanous Breathing and Patient connected to nasal cannula oxygen  Post-op Assessment: Report given to PACU RN and Post -op Vital signs reviewed and stable  Post vital signs: Reviewed and stable  Complications: No apparent anesthesia complications

## 2013-11-25 NOTE — Anesthesia Procedure Notes (Signed)
Procedure Name: LMA Insertion Date/Time: 11/25/2013 11:40 AM Performed by: Wanita Chamberlain Pre-anesthesia Checklist: Patient identified, Timeout performed, Emergency Drugs available, Suction available and Patient being monitored Patient Re-evaluated:Patient Re-evaluated prior to inductionOxygen Delivery Method: Circle system utilized Preoxygenation: Pre-oxygenation with 100% oxygen Intubation Type: IV induction Ventilation: Mask ventilation without difficulty LMA: LMA inserted LMA Size: 4.0 Number of attempts: 1 Airway Equipment and Method: Bite block Placement Confirmation: breath sounds checked- equal and bilateral and positive ETCO2 Tube secured with: Tape Dental Injury: Teeth and Oropharynx as per pre-operative assessment

## 2013-11-25 NOTE — Anesthesia Postprocedure Evaluation (Signed)
  Anesthesia Post-op Note  Patient: Mikayla Montes  Procedure(s) Performed: Procedure(s) (LRB): CYSTOSCOPY/HYDRODISTENSION, INSTALLATION OF MARCAINE & PYRIDIUM      (N/A)  Patient Location: PACU  Anesthesia Type: General  Level of Consciousness: awake and alert   Airway and Oxygen Therapy: Patient Spontanous Breathing  Post-op Pain: mild  Post-op Assessment: Post-op Vital signs reviewed, Patient's Cardiovascular Status Stable, Respiratory Function Stable, Patent Airway and No signs of Nausea or vomiting  Last Vitals:  Filed Vitals:   11/25/13 1245  BP: 112/93  Pulse: 71  Temp:   Resp: 18    Post-op Vital Signs: stable   Complications: No apparent anesthesia complications

## 2013-11-25 NOTE — Anesthesia Preprocedure Evaluation (Addendum)
Anesthesia Evaluation  Patient identified by MRN, date of birth, ID band Patient awake    Reviewed: Allergy & Precautions, H&P , NPO status , Patient's Chart, lab work & pertinent test results, reviewed documented beta blocker date and time   History of Anesthesia Complications (+) PONVNegative for: history of anesthetic complications  Airway Mallampati: II TM Distance: >3 FB Neck ROM: full    Dental   Pulmonary former smoker,  breath sounds clear to auscultation        Cardiovascular Exercise Tolerance: Good Rhythm:regular Rate:Normal     Neuro/Psych negative neurological ROS  negative psych ROS   GI/Hepatic negative GI ROS, Neg liver ROS, GERD-  ,  Endo/Other  negative endocrine ROS  Renal/GU negative Renal ROS  negative genitourinary   Musculoskeletal negative musculoskeletal ROS (+)   Abdominal   Peds negative pediatric ROS (+)  Hematology negative hematology ROS (+)   Anesthesia Other Findings   Reproductive/Obstetrics negative OB ROS                          Anesthesia Physical  Anesthesia Plan  ASA: II  Anesthesia Plan: General   Post-op Pain Management:    Induction:   Airway Management Planned: LMA and Oral ETT  Additional Equipment:   Intra-op Plan:   Post-operative Plan:   Informed Consent: I have reviewed the patients History and Physical, chart, labs and discussed the procedure including the risks, benefits and alternatives for the proposed anesthesia with the patient or authorized representative who has indicated his/her understanding and acceptance.   Dental Advisory Given  Plan Discussed with: CRNA and Surgeon  Anesthesia Plan Comments:         Anesthesia Quick Evaluation

## 2013-11-25 NOTE — Discharge Instructions (Addendum)
I have reviewed discharge instructions in detail with the patient. They will follow-up with me or their physician as scheduled. My nurse will also be calling the patients as per protocol.  °Post Anesthesia Home Care Instructions ° °Activity: °Get plenty of rest for the remainder of the day. A responsible adult should stay with you for 24 hours following the procedure.  °For the next 24 hours, DO NOT: °-Drive a car °-Operate machinery °-Drink alcoholic beverages °-Take any medication unless instructed by your physician °-Make any legal decisions or sign important papers. ° °Meals: °Start with liquid foods such as gelatin or soup. Progress to regular foods as tolerated. Avoid greasy, spicy, heavy foods. If nausea and/or vomiting occur, drink only clear liquids until the nausea and/or vomiting subsides. Call your physician if vomiting continues. ° °Special Instructions/Symptoms: °Your throat may feel dry or sore from the anesthesia or the breathing tube placed in your throat during surgery. If this causes discomfort, gargle with warm salt water. The discomfort should disappear within 24 hours. °CYSTOSCOPY HOME CARE INSTRUCTIONS ° °Activity: °Rest for the remainder of the day.  Do not drive or operate equipment today.  You may resume normal activities in one to two days as instructed by your physician.  ° °Meals: °Drink plenty of liquids and eat light foods such as gelatin or soup this evening.  You may return to a normal meal plan tomorrow. ° °Return to Work: °You may return to work in one to two days or as instructed by your physician. ° °Special Instructions / Symptoms: °Call your physician if any of these symptoms occur: ° ° -persistent or heavy bleeding ° -bleeding which continues after first few urination ° -large blood clots that are difficult to pass ° -urine stream diminishes or stops completely ° -fever equal to or higher than 101 degrees Farenheit. ° -cloudy urine with a strong, foul odor ° -severe  pain ° °Females should always wipe from front to back after elimination.  You may feel some burning pain when you urinate.  This should disappear with time.  Applying moist heat to the lower abdomen or a hot tub bath may help relieve the pain. \ ° °Follow-Up / Date of Return Visit to Your Physician:  *** °Call for an appointment to arrange follow-up. ° °Patient Signature:  ________________________________________________________ ° °Nurse's Signature:  ________________________________________________________ ° °

## 2013-11-25 NOTE — Op Note (Signed)
Preoperative diagnosis:  1. Pelvic/vaginal pain  Postoperative diagnosis: 1. Pelvic/vaginal pain  Procedure(s): 1. Cystoscopy with hydrodistention  Surgeon: Dr. Nicki Reaper Tanetta Fuhriman  Resident: Dr. Milon Score  Anesthesia: General  Complications: None  EBL: None  Specimens: None  Intraoperative findings: 600cc capacity under anesthesia. No glomerulations or Hunner's ulcers to suggest interstitial cystitis. Will follow up to discuss further rescue treatments per protocol.   Indication: 59F with history of pelvic pain primarily a "burning" sensation in the vagina. Discussed possible diagnostic and therapeutic merits as well as limitations of cystoscopy with hydrodistention in the setting of vaginal pain and undiagnosed interstitial cystitis. She consented to proceed.   Description of procedure:   The patient was verified in pre-operative holding area and brought back to the OR. General anesthesia was induced and IV antibiotics were given. She was then placed in dorsal lithotomy and prepped and draped in the usual sterile fashion. After a timeout, a rigid cystoscope was placed per urethra. No abnormalities were noted. The bladder was filled to capacity evidenced by leakage from the urethra. The bladder was emptied and measured 600cc. Cystoscopy was again performed and no glomerulations or Hunner's ulcers were noted. The bladder was emptied. Lidocaine/pyridium solution was placed through an I&O catheter. The patient was awoken from anesthesia having tolerated the procedure well.  Dr. Matilde Sprang was present for the entire procedure.

## 2013-11-26 ENCOUNTER — Encounter (HOSPITAL_BASED_OUTPATIENT_CLINIC_OR_DEPARTMENT_OTHER): Payer: Self-pay | Admitting: Urology

## 2013-12-03 ENCOUNTER — Encounter: Payer: Self-pay | Admitting: Nurse Practitioner

## 2013-12-03 ENCOUNTER — Other Ambulatory Visit: Payer: Self-pay | Admitting: Obstetrics and Gynecology

## 2013-12-04 LAB — CYTOLOGY - PAP

## 2013-12-10 ENCOUNTER — Encounter: Payer: 59 | Admitting: Gynecology

## 2014-01-09 ENCOUNTER — Other Ambulatory Visit: Payer: Self-pay

## 2014-01-26 ENCOUNTER — Encounter (HOSPITAL_BASED_OUTPATIENT_CLINIC_OR_DEPARTMENT_OTHER): Payer: Self-pay | Admitting: Urology

## 2014-03-12 ENCOUNTER — Other Ambulatory Visit (HOSPITAL_COMMUNITY): Payer: Self-pay | Admitting: Obstetrics and Gynecology

## 2014-03-12 DIAGNOSIS — Z1231 Encounter for screening mammogram for malignant neoplasm of breast: Secondary | ICD-10-CM

## 2014-03-24 ENCOUNTER — Ambulatory Visit (HOSPITAL_COMMUNITY): Payer: 59 | Attending: Obstetrics and Gynecology

## 2015-01-07 ENCOUNTER — Encounter: Payer: Self-pay | Admitting: Gastroenterology

## 2015-02-26 ENCOUNTER — Other Ambulatory Visit: Payer: Self-pay

## 2015-02-26 DIAGNOSIS — Z1231 Encounter for screening mammogram for malignant neoplasm of breast: Secondary | ICD-10-CM

## 2015-04-05 ENCOUNTER — Inpatient Hospital Stay: Admission: RE | Admit: 2015-04-05 | Payer: Self-pay | Source: Ambulatory Visit

## 2015-06-21 ENCOUNTER — Ambulatory Visit
Admission: RE | Admit: 2015-06-21 | Discharge: 2015-06-21 | Disposition: A | Discharge status: Home / Self Care | Payer: 59 | Source: Ambulatory Visit

## 2015-06-21 DIAGNOSIS — Z1231 Encounter for screening mammogram for malignant neoplasm of breast: Secondary | ICD-10-CM

## 2016-05-30 ENCOUNTER — Other Ambulatory Visit: Payer: Self-pay | Admitting: Obstetrics and Gynecology

## 2016-05-30 DIAGNOSIS — Z1231 Encounter for screening mammogram for malignant neoplasm of breast: Secondary | ICD-10-CM

## 2016-06-21 ENCOUNTER — Ambulatory Visit
Admission: RE | Admit: 2016-06-21 | Discharge: 2016-06-21 | Disposition: A | Discharge status: Home / Self Care | Payer: 59 | Source: Ambulatory Visit | Attending: Obstetrics and Gynecology | Admitting: Obstetrics and Gynecology

## 2016-06-21 DIAGNOSIS — Z1231 Encounter for screening mammogram for malignant neoplasm of breast: Secondary | ICD-10-CM

## 2017-05-09 ENCOUNTER — Other Ambulatory Visit: Payer: Self-pay | Admitting: Obstetrics and Gynecology

## 2017-05-09 DIAGNOSIS — Z1231 Encounter for screening mammogram for malignant neoplasm of breast: Secondary | ICD-10-CM

## 2017-06-04 ENCOUNTER — Ambulatory Visit
Admission: RE | Admit: 2017-06-04 | Discharge: 2017-06-04 | Disposition: A | Discharge status: Home / Self Care | Payer: 59 | Source: Ambulatory Visit | Attending: Obstetrics and Gynecology | Admitting: Obstetrics and Gynecology

## 2017-06-04 DIAGNOSIS — Z1231 Encounter for screening mammogram for malignant neoplasm of breast: Secondary | ICD-10-CM

## 2018-06-13 ENCOUNTER — Other Ambulatory Visit: Payer: Self-pay | Admitting: Family Medicine

## 2018-06-13 DIAGNOSIS — Z1231 Encounter for screening mammogram for malignant neoplasm of breast: Secondary | ICD-10-CM

## 2018-12-12 ENCOUNTER — Encounter: Payer: Self-pay | Admitting: Gastroenterology

## 2019-01-09 ENCOUNTER — Ambulatory Visit: Payer: Managed Care, Other (non HMO) | Admitting: Gastroenterology

## 2019-01-14 ENCOUNTER — Encounter: Payer: Self-pay | Admitting: Internal Medicine

## 2019-02-17 ENCOUNTER — Encounter: Payer: Self-pay | Admitting: *Deleted

## 2019-02-25 ENCOUNTER — Ambulatory Visit: Payer: Self-pay | Admitting: Internal Medicine

## 2019-04-01 ENCOUNTER — Ambulatory Visit: Payer: Self-pay | Admitting: Podiatry

## 2019-04-07 ENCOUNTER — Ambulatory Visit: Payer: Self-pay | Attending: Internal Medicine

## 2019-04-07 DIAGNOSIS — Z20822 Contact with and (suspected) exposure to covid-19: Secondary | ICD-10-CM

## 2019-04-08 LAB — NOVEL CORONAVIRUS, NAA: SARS-CoV-2, NAA: DETECTED — AB

## 2019-04-18 ENCOUNTER — Ambulatory Visit: Payer: Self-pay | Admitting: Internal Medicine

## 2020-01-02 ENCOUNTER — Other Ambulatory Visit: Payer: Self-pay | Admitting: Family Medicine

## 2020-01-02 ENCOUNTER — Ambulatory Visit
Admission: RE | Admit: 2020-01-02 | Discharge: 2020-01-02 | Disposition: A | Discharge status: Home / Self Care | Payer: No Typology Code available for payment source | Source: Ambulatory Visit | Attending: Family Medicine | Admitting: Family Medicine

## 2020-01-02 DIAGNOSIS — R0789 Other chest pain: Secondary | ICD-10-CM

## 2020-01-06 ENCOUNTER — Other Ambulatory Visit: Payer: Self-pay | Admitting: Obstetrics and Gynecology

## 2020-01-06 DIAGNOSIS — Z1231 Encounter for screening mammogram for malignant neoplasm of breast: Secondary | ICD-10-CM

## 2020-01-30 ENCOUNTER — Other Ambulatory Visit: Payer: Self-pay

## 2020-01-30 ENCOUNTER — Ambulatory Visit
Admission: RE | Admit: 2020-01-30 | Discharge: 2020-01-30 | Disposition: A | Discharge status: Home / Self Care | Payer: No Typology Code available for payment source | Source: Ambulatory Visit | Attending: Obstetrics and Gynecology | Admitting: Obstetrics and Gynecology

## 2020-01-30 DIAGNOSIS — Z1231 Encounter for screening mammogram for malignant neoplasm of breast: Secondary | ICD-10-CM

## 2021-01-03 ENCOUNTER — Other Ambulatory Visit: Payer: Self-pay | Admitting: Family Medicine

## 2021-01-03 DIAGNOSIS — Z1231 Encounter for screening mammogram for malignant neoplasm of breast: Secondary | ICD-10-CM

## 2021-01-26 ENCOUNTER — Encounter: Payer: Self-pay | Admitting: Nurse Practitioner

## 2021-02-08 ENCOUNTER — Ambulatory Visit
Admission: RE | Admit: 2021-02-08 | Discharge: 2021-02-08 | Disposition: A | Discharge status: Home / Self Care | Payer: No Typology Code available for payment source | Source: Ambulatory Visit | Attending: Family Medicine | Admitting: Family Medicine

## 2021-02-08 DIAGNOSIS — Z1231 Encounter for screening mammogram for malignant neoplasm of breast: Secondary | ICD-10-CM

## 2021-02-11 ENCOUNTER — Other Ambulatory Visit: Payer: Self-pay

## 2021-02-11 DIAGNOSIS — E039 Hypothyroidism, unspecified: Secondary | ICD-10-CM | POA: Insufficient documentation

## 2021-02-11 DIAGNOSIS — J45909 Unspecified asthma, uncomplicated: Secondary | ICD-10-CM | POA: Insufficient documentation

## 2021-02-11 DIAGNOSIS — N951 Menopausal and female climacteric states: Secondary | ICD-10-CM | POA: Insufficient documentation

## 2021-02-11 DIAGNOSIS — M797 Fibromyalgia: Secondary | ICD-10-CM | POA: Insufficient documentation

## 2021-02-11 DIAGNOSIS — N952 Postmenopausal atrophic vaginitis: Secondary | ICD-10-CM | POA: Insufficient documentation

## 2021-02-11 DIAGNOSIS — J309 Allergic rhinitis, unspecified: Secondary | ICD-10-CM | POA: Insufficient documentation

## 2021-02-11 DIAGNOSIS — K589 Irritable bowel syndrome without diarrhea: Secondary | ICD-10-CM | POA: Insufficient documentation

## 2021-02-11 DIAGNOSIS — M8580 Other specified disorders of bone density and structure, unspecified site: Secondary | ICD-10-CM | POA: Insufficient documentation

## 2021-02-15 ENCOUNTER — Ambulatory Visit: Payer: No Typology Code available for payment source | Admitting: Nurse Practitioner

## 2021-05-06 ENCOUNTER — Encounter: Payer: Self-pay | Admitting: Gastroenterology

## 2021-05-20 ENCOUNTER — Ambulatory Visit: Payer: Self-pay | Admitting: Gastroenterology

## 2021-05-20 ENCOUNTER — Other Ambulatory Visit (INDEPENDENT_AMBULATORY_CARE_PROVIDER_SITE_OTHER): Payer: Self-pay

## 2021-05-20 ENCOUNTER — Encounter: Payer: Self-pay | Admitting: Gastroenterology

## 2021-05-20 VITALS — BP 99/62 | HR 81 | Wt 122.1 lb

## 2021-05-20 DIAGNOSIS — R194 Change in bowel habit: Secondary | ICD-10-CM

## 2021-05-20 DIAGNOSIS — K589 Irritable bowel syndrome without diarrhea: Secondary | ICD-10-CM

## 2021-05-20 LAB — COMPREHENSIVE METABOLIC PANEL
ALT: 10 U/L (ref 0–35)
AST: 19 U/L (ref 0–37)
Albumin: 4.1 g/dL (ref 3.5–5.2)
Alkaline Phosphatase: 62 U/L (ref 39–117)
BUN: 10 mg/dL (ref 6–23)
CO2: 35 mEq/L — ABNORMAL HIGH (ref 19–32)
Calcium: 9.5 mg/dL (ref 8.4–10.5)
Chloride: 102 mEq/L (ref 96–112)
Creatinine, Ser: 0.94 mg/dL (ref 0.40–1.20)
GFR: 66.42 mL/min (ref >60.00)
Glucose, Bld: 75 mg/dL (ref 70–99)
Potassium: 4.3 mEq/L (ref 3.5–5.1)
Sodium: 138 mEq/L (ref 135–145)
Total Bilirubin: 0.3 mg/dL (ref 0.2–1.2)
Total Protein: 7 g/dL (ref 6.0–8.3)

## 2021-05-20 LAB — CBC WITH DIFFERENTIAL/PLATELET
Basophils Absolute: 0.1 10*3/uL (ref 0.0–0.1)
Basophils Relative: 1.3 % (ref 0.0–3.0)
Eosinophils Absolute: 0.1 10*3/uL (ref 0.0–0.7)
Eosinophils Relative: 3 % (ref 0.0–5.0)
HCT: 37.4 % (ref 36.0–46.0)
Hemoglobin: 12.6 g/dL (ref 12.0–15.0)
Lymphocytes Relative: 33.4 % (ref 12.0–46.0)
Lymphs Abs: 1.5 10*3/uL (ref 0.7–4.0)
MCHC: 33.6 g/dL (ref 30.0–36.0)
MCV: 91 fl (ref 78.0–100.0)
Monocytes Absolute: 0.4 10*3/uL (ref 0.1–1.0)
Monocytes Relative: 8.7 % (ref 3.0–12.0)
Neutro Abs: 2.3 10*3/uL (ref 1.4–7.7)
Neutrophils Relative %: 53.6 % (ref 43.0–77.0)
Platelets: 243 10*3/uL (ref 150.0–400.0)
RBC: 4.11 Mil/uL (ref 3.87–5.11)
RDW: 12.9 % (ref 11.5–15.5)
WBC: 4.4 10*3/uL (ref 4.0–10.5)

## 2021-05-20 LAB — C-REACTIVE PROTEIN: CRP: <1 mg/dL (ref 0.5–20.0)

## 2021-05-20 LAB — TSH: TSH: 3.76 u[IU]/mL (ref 0.35–5.50)

## 2021-05-20 NOTE — Patient Instructions (Signed)
Your provider has requested that you go to the basement level for lab work before leaving today. Press "B" on the elevator. The lab is located at the first door on the left as you exit the elevator.  If you are age 60 or older, your body mass index should be between 23-30. Your Body mass index is 22.34 kg/m. If this is out of the aforementioned range listed, please consider follow up with your Primary Care Provider.  If you are age 44 or younger, your body mass index should be between 19-25. Your Body mass index is 22.34 kg/m. If this is out of the aformentioned range listed, please consider follow up with your Primary Care Provider.   ________________________________________________________  The [facility] providers would like to encourage you to use [facility] to communicate with providers for non-urgent requests or questions.  Due to long hold times on the telephone, sending your provider a message by [facility] may be a faster and more efficient way to get a response.  Please allow 48 business hours for a response.  Please remember that this is for non-urgent requests.  _______________________________________________________

## 2021-05-20 NOTE — Progress Notes (Signed)
05/20/2021 Mikayla Montes [id number] 1961-03-08   HISTORY OF PRESENT ILLNESS: This is a 61 year old female who is previously a patient of Dr. Buel Ream and then a patient of Dr. Kelby Fam.  She has not been seen here since 2014.  Has longstanding history of IBS.  There was reported questionable remote history of Crohn's disease, but colonoscopy in 1998 and then again in 2014 were negative.  She has a lot of issues with anxiety, etc.  She is treated for that and follows with psychiatrist.  She is here today wanting to discuss repeating a colonoscopy.  Last colonoscopy in 2014 was normal.  She tells me that she gets a lot of rectal spasms, but she does have Levsin at home and she uses that on an as-needed basis and that does help.  She says that she sees occasional mucus in her stool, but no blood except for very small amounts of bright red blood on the toilet paper on rare occasion.  He says that for the past couple of months she has had horrible gas.  She says that she also likely has lactose intolerance so his cut out all dairy products.  She says that she is not drinking any diet drinks, etc.  Nothing has really made a difference in the gas.  Bowel habits tend to alternate between constipation and loose stools.  She says she tried fiber before, but it caused a lot of gas and other issues.  She has not had any recent labs, etc.   Past Medical History:  Diagnosis Date   Anxiety    Bacterial vaginosis    Depression    Dyspareunia    Endometriosis    Frequency of urination    GERD (gastroesophageal reflux disease)    Irritable bowel syndrome    Lactose intolerance    Pelvic pain in female    PONV (postoperative nausea and vomiting)    Seasonal asthma    Wears glasses    Past Surgical History:  Procedure Laterality Date   CESAREAN SECTION     COLONOSCOPY WITH PROPOFOL  10-24-2012   CYSTOSCOPY WITH HYDRODISTENSION AND BIOPSY N/A 11/25/2013   Procedure: CYSTOSCOPY/HYDRODISTENSION,  INSTALLATION OF MARCAINE & PYRIDIUM     ;  Surgeon: Reece Packer, MD;  Location: [facility];  Service: Urology;  Laterality: N/A;   LAPAROSCOPIC ASSISTED VAGINAL HYSTERECTOMY  1999   Endometriosis/ovarian cyst/pelvic pain   LAPAROSCOPIC OVARIAN CYSTECTOMY  X3   last one 06-13-1996   last one w/ lysis adhesions,  albation endometriosis and bilataral ovarian cyst   LAPAROSCOPY Bilateral 09/19/2013   Procedure: LAPAROSCOPY OPERATIVE WITH BILATERAL SALPINGO OOPHORECTOMY AND EXCISION OF PERITONEAL MASS;  Surgeon: Anastasio Auerbach, MD;  Location: [facility];  Service: Gynecology;  Laterality: Bilateral;   TUBAL LIGATION      reports that she quit smoking about 22 years ago. Her smoking use included cigarettes. She has a 15.00 pack-year smoking history. She has never used smokeless tobacco. She reports current alcohol use. She reports that she does not use drugs. family history includes Hypertension in her father and mother; Other in her mother. Allergies  Allergen Reactions   Doxycycline Hyclate Nausea And Vomiting    Other reaction(s): vomiting   Est Estrogens-Methyltest Other (See Comments)    Cardiac symptoms, chest/shoulder pain, heartburn, cramps, fluid, fatiqued   Sulfa Antibiotics Nausea And Vomiting   Metronidazole Dermatitis   Penicillins Hives      Outpatient Encounter Medications as of 05/20/2021  Medication Sig   ALBUTEROL IN Inhale into the lungs as needed.   ALPRAZolam (XANAX) 1 MG tablet Take 1 mg by mouth 3 (three) times daily as needed for anxiety. As needed   amitriptyline (ELAVIL) 25 MG tablet Take 1 tablet (25 mg total) by mouth at bedtime.   b complex vitamins tablet Take 1 tablet by mouth daily.   Cholecalciferol (VITAMIN D) 2000 UNITS tablet Take 6,000 Units by mouth daily.   Docusate Sodium 100 MG capsule 100 mg daily as needed.   estradiol (ESTRACE) 2 MG tablet Take 2 mg by mouth every evening.   estradiol (ESTRACE) 2 MG tablet 2 mg. 1 tablet daily    hyoscyamine (LEVSIN, ANASPAZ) 0.125 MG tablet Take 0.125 mg by mouth every 6 (six) hours as needed for cramping.   Magnesium 250 MG TABS 250 mg. Once daily with a meal   Multiple Vitamins-Minerals (MULTIVITAMIN PO) Take 1 tablet by mouth daily.    nefazodone (SERZONE) 150 MG tablet Take 450 mg by mouth at bedtime.    vitamin C (ASCORBIC ACID) 500 MG tablet Take 500 mg by mouth daily.   vitamin E 180 MG (400 UNITS) capsule Take 400 Units by mouth daily.   [DISCONTINUED] HYDROcodone-acetaminophen (NORCO) 5-325 MG per tablet Take 1 tablet by mouth every 6 (six) hours as needed for moderate pain.   [DISCONTINUED] levothyroxine (SYNTHROID) 50 MCG tablet 50 mcg.   No facility-administered encounter medications on file as of 05/20/2021.     REVIEW OF SYSTEMS  : All other systems reviewed and negative except where noted in the History of Present Illness.   PHYSICAL EXAM: BP 99/62    Pulse 81    Wt 122 lb 2 oz (55.4 kg)    SpO2 100%    BMI 22.34 kg/m  General: Well developed white female in no acute distress Head: Normocephalic and atraumatic Eyes:  Sclerae anicteric, conjunctiva pink. Ears: Normal auditory acuity Lungs: Clear throughout to auscultation; no W/R/R. Heart: Regular rate and rhythm; no M/R/G. Abdomen: Soft, non-distended.  BS present.  Non-tender. Rectal:  Will be done at the time of colonoscopy. Musculoskeletal: Symmetrical with no gross deformities  Skin: No lesions on visible extremities Extremities: No edema  Neurological: Alert oriented x 4, grossly non-focal Psychological:  Alert and cooperative. Normal mood and affect  ASSESSMENT AND PLAN: *61 year old female with longstanding IBS with alternating bowel habits, a lot of gas/flatulence for the last couple of months, rectal spasms that is relieved by Levsin.  Her last colonoscopy was in 2014.  She would like to have another one performed at this time.  Will plan for another colonoscopy.  She says that she starts a new job  next week and does not have insurance for 60 days so she will have to wait until May to have that done.  We discussed having labs performed since she has not even had any routine labs done.  She is agreeable to have that done today although I did tell her that she could wait to have it done when her insurance is effective as well.  We will check a CBC, CMP, TSH, CRP, celiac labs.  We discussed FODMAP diet and she was given literature on this.  Continue her Levsin as needed.  She will call back to schedule her colonoscopy when able.   CC:  London Pepper, MD

## 2021-05-23 LAB — TISSUE TRANSGLUTAMINASE ABS,IGG,IGA
(tTG) Ab, IgA: <1 U/mL
(tTG) Ab, IgG: <1 U/mL

## 2021-05-23 LAB — IGA: Immunoglobulin A: 236 mg/dL (ref 47–310)

## 2021-05-30 NOTE — Progress Notes (Signed)
Addendum: Reviewed and agree with assessment and management plan. Kenta Laster M, MD  

## 2021-07-13 ENCOUNTER — Encounter: Payer: Self-pay | Admitting: Internal Medicine

## 2021-08-11 ENCOUNTER — Ambulatory Visit (AMBULATORY_SURGERY_CENTER): Payer: Self-pay

## 2021-08-11 VITALS — Ht 62.0 in | Wt 120.0 lb

## 2021-08-11 DIAGNOSIS — R194 Change in bowel habit: Secondary | ICD-10-CM

## 2021-08-11 DIAGNOSIS — K589 Irritable bowel syndrome without diarrhea: Secondary | ICD-10-CM

## 2021-08-11 MED ORDER — SUTAB 1479-225-188 MG PO TABS: 1.0000 | ORAL_TABLET | ORAL | 0 refills | Status: DC

## 2021-08-11 NOTE — Progress Notes (Signed)
No egg or soy allergy known to patient  No issues known to pt with past sedation with any surgeries or procedures Patient denies ever being told they had issues or difficulty with intubation  No FH of Malignant Hyperthermia Pt is not on diet pills Pt is not on home 02  Pt is not on blood thinners  Pt reports issues with constipation -patient reports she takes daily stools softener to prevent constipation-however, patient advised to continue taking this medication and to also increase oral fluids and activity; No A fib or A flutter  PV completed over the phone. Pt verified name, DOB, address and insurance during PV today.  Patient instructions and sample of prep placed at front desk for patient to pick up at her request;  Pt encouraged to call with questions or issues.  Pt has My chart, procedure instructions sent via My Chart  Insurance confirmed with pt at [facility] today    Prep sample [id number]  exp: 10/24

## 2021-09-07 ENCOUNTER — Ambulatory Visit (AMBULATORY_SURGERY_CENTER): Payer: Self-pay | Admitting: Internal Medicine

## 2021-09-07 ENCOUNTER — Other Ambulatory Visit: Payer: Self-pay | Admitting: *Deleted

## 2021-09-07 ENCOUNTER — Encounter: Payer: Self-pay | Admitting: Internal Medicine

## 2021-09-07 VITALS — BP 131/75 | HR 70 | Temp 96.2°F | Resp 12 | Ht 62.0 in | Wt 120.0 lb

## 2021-09-07 DIAGNOSIS — R140 Abdominal distension (gaseous): Secondary | ICD-10-CM

## 2021-09-07 DIAGNOSIS — R194 Change in bowel habit: Secondary | ICD-10-CM

## 2021-09-07 DIAGNOSIS — K582 Mixed irritable bowel syndrome: Secondary | ICD-10-CM

## 2021-09-07 DIAGNOSIS — D123 Benign neoplasm of transverse colon: Secondary | ICD-10-CM

## 2021-09-07 DIAGNOSIS — D122 Benign neoplasm of ascending colon: Secondary | ICD-10-CM

## 2021-09-07 MED ORDER — RIFAXIMIN 550 MG PO TABS: 550.0000 mg | ORAL_TABLET | Freq: Three times a day (TID) | ORAL | 0 refills | Status: AC

## 2021-09-07 MED ORDER — SODIUM CHLORIDE 0.9 % IV SOLN: 500.0000 mL | Freq: Once | INTRAVENOUS | Status: DC

## 2021-09-07 MED ORDER — HYOSCYAMINE SULFATE 0.125 MG PO TABS: 0.1250 mg | ORAL_TABLET | Freq: Four times a day (QID) | ORAL | 11 refills | Status: AC | PRN

## 2021-09-07 NOTE — Progress Notes (Signed)
Called to room to assist during endoscopic procedure.  Patient ID and intended procedure confirmed with present staff. Received instructions for my participation in the procedure from the performing physician.  

## 2021-09-07 NOTE — Progress Notes (Signed)
Pt's states no medical or surgical changes since previsit or office visit. 

## 2021-09-07 NOTE — Patient Instructions (Addendum)
Resume previous medications.  1 polyp removed and sent to pathology.  Await results for final recommendations.    Handouts on findings given to patient ( polyps)    YOU HAD AN ENDOSCOPIC PROCEDURE TODAY AT [facility]:   Refer to the procedure report that was given to you for any specific questions about what was found during the examination.  If the procedure report does not answer your questions, please call your gastroenterologist to clarify.  If you requested that your care partner not be given the details of your procedure findings, then the procedure report has been included in a sealed envelope for you to review at your convenience later.  YOU SHOULD EXPECT: Some feelings of bloating in the abdomen. Passage of more gas than usual.  Walking can help get rid of the air that was put into your GI tract during the procedure and reduce the bloating. If you had a lower endoscopy (such as a colonoscopy or flexible sigmoidoscopy) you may notice spotting of blood in your stool or on the toilet paper. If you underwent a bowel prep for your procedure, you may not have a normal bowel movement for a few days.  Please Note:  You might notice some irritation and congestion in your nose or some drainage.  This is from the oxygen used during your procedure.  There is no need for concern and it should clear up in a day or so.  SYMPTOMS TO REPORT IMMEDIATELY:  Following lower endoscopy (colonoscopy or flexible sigmoidoscopy):  Excessive amounts of blood in the stool  Significant tenderness or worsening of abdominal pains  Swelling of the abdomen that is new, acute  Fever of 100F or higher   For urgent or emergent issues, a gastroenterologist can be reached at any hour by calling [phone]. Do not use MyChart messaging for urgent concerns.    DIET:  We do recommend a small meal at first, but then you may proceed to your regular diet.  Drink plenty of fluids but you should avoid  alcoholic beverages for 24 hours.  ACTIVITY:  You should plan to take it easy for the rest of today and you should NOT DRIVE or use heavy machinery until tomorrow (because of the sedation medicines used during the test).    FOLLOW UP: Our staff will call the number listed on your records 24-72 hours following your procedure to check on you and address any questions or concerns that you may have regarding the information given to you following your procedure. If we do not reach you, we will leave a message.  We will attempt to reach you two times.  During this call, we will ask if you have developed any symptoms of COVID 19. If you develop any symptoms (ie: fever, flu-like symptoms, shortness of breath, cough etc.) before then, please call [phone].  If you test positive for Covid 19 in the 2 weeks post procedure, please call and report this information to [place].    If any biopsies were taken you will be contacted by phone or by letter within the next 1-3 weeks.  Please call us at [phone] if you have not heard about the biopsies in 3 weeks.    SIGNATURES/CONFIDENTIALITY: You and/or your care partner have signed paperwork which will be entered into your electronic medical record.  These signatures attest to the fact that that the information above on your After Visit Summary has been reviewed and is understood.  Full responsibility of  the confidentiality of this discharge information lies with you and/or your care-partner.

## 2021-09-07 NOTE — Progress Notes (Signed)
To pacu, VSS. Report to [email or web address] 

## 2021-09-07 NOTE — Progress Notes (Signed)
GASTROENTEROLOGY PROCEDURE H&P NOTE   Primary Care Physician: London Pepper, MD    Reason for Procedure:  Alternating bowel habits, gas and bloating  Plan:    Colonoscopy  Patient is appropriate for endoscopic procedure(s) in the ambulatory ([place]) setting.  The nature of the procedure, as well as the risks, benefits, and alternatives were carefully and thoroughly reviewed with the patient. Ample time for discussion and questions allowed. The patient understood, was satisfied, and agreed to proceed.     HPI: Mikayla Montes is a 61 y.o. female who presents for colonoscopy.  Medical history as below.  Tolerated the prep.  No recent chest pain or shortness of breath.  No abdominal pain today.  Past Medical History:  Diagnosis Date   Anxiety    on meds   Bacterial vaginosis    Depression    on meds   Dyspareunia    Endometriosis    Frequency of urination    GERD (gastroesophageal reflux disease)    Irritable bowel syndrome    Lactose intolerance    Pelvic pain in female    PONV (postoperative nausea and vomiting)    Seasonal allergies    Seasonal asthma    seasonal allergy related asthma   Wears glasses     Past Surgical History:  Procedure Laterality Date   CESAREAN SECTION     COLONOSCOPY WITH PROPOFOL  10/24/2012   Dr.Kaplan-suprep(exc)   CYSTOSCOPY WITH HYDRODISTENSION AND BIOPSY N/A 11/25/2013   Procedure: CYSTOSCOPY/HYDRODISTENSION, INSTALLATION OF MARCAINE & PYRIDIUM     ;  Surgeon: Reece Packer, MD;  Location: [facility];  Service: Urology;  Laterality: N/A;   LAPAROSCOPIC ASSISTED VAGINAL HYSTERECTOMY  03/27/1997   Endometriosis/ovarian cyst/pelvic pain   LAPAROSCOPIC OOPHERECTOMY Bilateral    LAPAROSCOPIC OVARIAN CYSTECTOMY  X3   last one 06-13-1996   last one w/ lysis adhesions,  albation endometriosis and bilataral ovarian cyst   LAPAROSCOPY Bilateral 09/19/2013   Procedure: LAPAROSCOPY OPERATIVE WITH BILATERAL SALPINGO  OOPHORECTOMY AND EXCISION OF PERITONEAL MASS;  Surgeon: Anastasio Auerbach, MD;  Location: [facility];  Service: Gynecology;  Laterality: Bilateral;   TUBAL LIGATION      Prior to Admission medications   Medication Sig Start Date End Date Taking? Authorizing Provider  ALPRAZolam Duanne Moron) 1 MG tablet Take 1 mg by mouth 3 (three) times daily as needed for anxiety. As needed 09/04/12  Yes [provider]  amitriptyline (ELAVIL) 25 MG tablet Take 1 tablet (25 mg total) by mouth at bedtime. 09/23/13  Yes Fontaine, Belinda Block, MD  b complex vitamins tablet Take 1 tablet by mouth daily.   Yes [provider]  Cholecalciferol (VITAMIN D) 2000 UNITS tablet Take 6,000 Units by mouth daily.   Yes [provider]  citalopram (CELEXA) 10 MG tablet Take 10 mg by mouth at bedtime. 08/02/21  Yes [provider]  estradiol (ESTRACE) 2 MG tablet Take 2 mg by mouth every evening. 09/17/13  Yes Fontaine, Belinda Block, MD  hyoscyamine (LEVSIN, ANASPAZ) 0.125 MG tablet Take 0.125 mg by mouth every 6 (six) hours as needed for cramping.   Yes [provider]  Magnesium 250 MG TABS 250 mg. Once daily with a meal   Yes [provider]  Multiple Vitamins-Minerals (MULTIVITAMIN PO) Take 1 tablet by mouth daily.    Yes [provider]  vitamin C (ASCORBIC ACID) 500 MG tablet Take 500 mg by mouth daily.   Yes [provider]  vitamin  E 180 MG (400 UNITS) capsule Take 400 Units by mouth daily.   Yes [provider]  Docusate Sodium 100 MG capsule 100 mg daily as needed.    [provider]    Current Outpatient Medications  Medication Sig Dispense Refill   ALPRAZolam (XANAX) 1 MG tablet Take 1 mg by mouth 3 (three) times daily as needed for anxiety. As needed     amitriptyline (ELAVIL) 25 MG tablet Take 1 tablet (25 mg total) by mouth at bedtime. 30 tablet 1   b complex vitamins tablet Take 1 tablet by mouth daily.     Cholecalciferol (VITAMIN D)  2000 UNITS tablet Take 6,000 Units by mouth daily.     citalopram (CELEXA) 10 MG tablet Take 10 mg by mouth at bedtime.     estradiol (ESTRACE) 2 MG tablet Take 2 mg by mouth every evening.     hyoscyamine (LEVSIN, ANASPAZ) 0.125 MG tablet Take 0.125 mg by mouth every 6 (six) hours as needed for cramping.     Magnesium 250 MG TABS 250 mg. Once daily with a meal     Multiple Vitamins-Minerals (MULTIVITAMIN PO) Take 1 tablet by mouth daily.      vitamin C (ASCORBIC ACID) 500 MG tablet Take 500 mg by mouth daily.     vitamin E 180 MG (400 UNITS) capsule Take 400 Units by mouth daily.     Docusate Sodium 100 MG capsule 100 mg daily as needed.     Current Facility-Administered Medications  Medication Dose Route Frequency Provider Last Rate Last Admin   0.9 %  sodium chloride infusion  500 mL Intravenous Once Sable Knoles, Lajuan Lines, MD        Allergies as of 09/07/2021 - Review Complete 09/07/2021  Allergen Reaction Noted   Doxycycline hyclate Nausea And Vomiting 12/31/2019   Est estrogens-methyltest Other (See Comments) 10/14/2012   Sulfa antibiotics Nausea And Vomiting 08/22/2013   Sulfamethoxazole Nausea And Vomiting 06/15/2021   Sulfamethoxazole-trimethoprim  06/15/2021   Metronidazole Dermatitis 06/04/2015   Penicillins Hives 10/14/2012    Family History  Problem Relation Age of Onset   Hypertension Mother    Other Mother    Hypertension Father    Colon cancer Neg Hx    Colon polyps Neg Hx    Rectal cancer Neg Hx    Stomach cancer Neg Hx    Esophageal cancer Neg Hx     Social History   Socioeconomic History   Marital status: Married    Spouse name: Not on file   Number of children: 1   Years of education: Not on file   Highest education level: Not on file  Occupational History   Not on file  Tobacco Use   Smoking status: Former    Packs/day: 1.00    Years: 15.00    Total pack years: 15.00    Types: Cigarettes    Quit date: 11/19/1998    Years since quitting: 22.8    Smokeless tobacco: Never  Vaping Use   Vaping Use: Never used  Substance and Sexual Activity   Alcohol use: Not Currently    Alcohol/week: 0.0 - 2.0 standard drinks of alcohol    Comment: socially   Drug use: No   Sexual activity: Not on file  Other Topics Concern   Not on file  Social History Narrative   Not on file   Social Determinants of Health   Financial Resource Strain: Not on file  Food Insecurity: Not on file  Transportation Needs: Not on file  Physical Activity: Not on file  Stress: Not on file  Social Connections: Not on file  Intimate Partner Violence: Not on file    Physical Exam: Vital signs in last 24 hours: '@BP'$  136/81   Pulse 87   Temp (!) 96.2 F (35.7 C)   Ht '5\' 2"'$  (1.575 m)   Wt 120 lb (54.4 kg)   SpO2 (!) 85%   BMI 21.95 kg/m  GEN: NAD EYE: Sclerae anicteric ENT: MMM CV: Non-tachycardic Pulm: CTA b/l GI: Soft, NT/ND NEURO:  Alert & Oriented x 3   Zenovia Jarred, MD [facility]  09/07/2021 9:03 AM

## 2021-09-07 NOTE — Op Note (Signed)
[facility] Patient Name: Mikayla Montes Procedure Date: 09/07/2021 9:01 AM MRN: [id number] Endoscopist: Jerene Bears , MD Age: 61 Referring MD:  Date of Birth: Jul 31, 1960 Gender: Female Account #: [id number] Procedure:                Colonoscopy Indications:              Change in bowel habits, alternating bowel habits,                            abdominal gas and bloating, normal colonoscopy 2014 Medicines:                Monitored Anesthesia Care Procedure:                Pre-Anesthesia Assessment:                           - Prior to the procedure, a History and Physical                            was performed, and patient medications and                            allergies were reviewed. The patient's tolerance of                            previous anesthesia was also reviewed. The risks                            and benefits of the procedure and the sedation                            options and risks were discussed with the patient.                            All questions were answered, and informed consent                            was obtained. Prior Anticoagulants: The patient has                            taken no previous anticoagulant or antiplatelet                            agents. ASA Grade Assessment: II - A patient with                            mild systemic disease. After reviewing the risks                            and benefits, the patient was deemed in                            satisfactory condition to undergo the procedure.  After obtaining informed consent, the colonoscope                            was passed under direct vision. Throughout the                            procedure, the patient's blood pressure, pulse, and                            oxygen saturations were monitored continuously. The                            Olympus PCF-H190DL (#[id number]) Colonoscope was                            introduced  through the anus and advanced to the the                            cecum, identified by appendiceal orifice and                            ileocecal valve. The colonoscopy was performed                            without difficulty. The patient tolerated the                            procedure well. The quality of the bowel                            preparation was good. The ileocecal valve,                            appendiceal orifice, and rectum were photographed. Scope In: 9:11:05 AM Scope Out: 9:26:46 AM Scope Withdrawal Time: 0 hours 11 minutes 36 seconds  Total Procedure Duration: 0 hours 15 minutes 41 seconds  Findings:                 The digital rectal exam was normal.                           A 4 mm polyp was found in the transverse colon. The                            polyp was sessile. The polyp was removed with a                            cold snare. Resection and retrieval were complete.                           The exam was otherwise without abnormality on                            direct and retroflexion views. Complications:  No immediate complications. Estimated Blood Loss:     Estimated blood loss: none. Impression:               - One 4 mm polyp in the transverse colon, removed                            with a cold snare. Resected and retrieved.                           - The examination was otherwise normal on direct                            and retroflexion views. Recommendation:           - Patient has a contact number available for                            emergencies. The signs and symptoms of potential                            delayed complications were discussed with the                            patient. Return to normal activities tomorrow.                            Written discharge instructions were provided to the                            patient.                           - Resume previous diet.                           -  Continue present medications.                           - Await pathology results.                           - Rifaximin 550 mg TID x 14 days for IBS. Office                            follow-up if persistent symptoms.                           - Repeat colonoscopy is recommended. The                            colonoscopy date will be determined after pathology                            results from today's exam become available for  review. Jerene Bears, MD 09/07/2021 9:46:18 AM This report has been signed electronically.

## 2021-09-08 ENCOUNTER — Telehealth: Payer: Self-pay

## 2021-09-08 ENCOUNTER — Telehealth: Payer: Self-pay | Admitting: *Deleted

## 2021-09-08 NOTE — Telephone Encounter (Signed)
Left message on follow up call. 

## 2021-09-08 NOTE — Telephone Encounter (Signed)
  Follow up Call-     09/07/2021    8:20 AM  Call back number  Post procedure Call Back phone  # [phone]  Permission to leave phone message Yes     Patient questions:  Do you have a fever, pain , or abdominal swelling? No. Pain Score  0 *  Have you tolerated food without any problems? Yes.    Have you been able to return to your normal activities? Yes.    Do you have any questions about your discharge instructions: Diet   No. Medications  No. Follow up visit  No.  Do you have questions or concerns about your Care? Yes.    Pt can't afford Rifaximin and would like to know if can get a sample or a different medication.   Actions: * If pain score is 4 or above: No action needed, pain <4.

## 2021-09-08 NOTE — Telephone Encounter (Signed)
Patient needs rifaximin 550 mg 3 times daily x10 to 14 days.  Will need samples if she is uninsured. Please provide when possible and notify the patient

## 2021-09-16 ENCOUNTER — Encounter: Payer: Self-pay | Admitting: Internal Medicine

## 2022-02-07 ENCOUNTER — Ambulatory Visit: Payer: Self-pay

## 2022-02-07 ENCOUNTER — Ambulatory Visit (INDEPENDENT_AMBULATORY_CARE_PROVIDER_SITE_OTHER): Payer: Self-pay

## 2022-02-07 ENCOUNTER — Ambulatory Visit (INDEPENDENT_AMBULATORY_CARE_PROVIDER_SITE_OTHER): Payer: Self-pay | Admitting: Orthopaedic Surgery

## 2022-02-07 DIAGNOSIS — M25522 Pain in left elbow: Secondary | ICD-10-CM

## 2022-02-07 DIAGNOSIS — M25521 Pain in right elbow: Secondary | ICD-10-CM

## 2022-02-07 NOTE — Progress Notes (Addendum)
Office Visit Note   Patient: Mikayla Montes           Date of Birth: 10-08-1960           MRN: [id number] Visit Date: 02/07/2022              Requested by: London Pepper, MD [address],  [place] [address] PCP: London Pepper, MD   Assessment & Plan: Visit Diagnoses:  1. Pain in right elbow   2. Pain in left elbow     Plan: Impression is bilateral elbow pain.  Left greater than right lateral epicondylitis.  Disease process and treatment options were explained and she opted for cortisone injections today.  We also provided home exercises.  We talked about the importance of rest.  Follow-up as needed.  Follow-Up Instructions: No follow-ups on file.   Orders:  Orders Placed This Encounter  Procedures   Medium Joint Inj: bilateral lateral epicondyle   XR Elbow Complete Right (3+View)   XR Elbow Complete Left (3+View)   No orders of the defined types were placed in this encounter.     Procedures: Medium Joint Inj: bilateral lateral epicondyle on 02/07/2022 1:59 PM Indications: pain Details: 25 G needle Outcome: tolerated well, no immediate complications Patient was prepped and draped in the usual sterile fashion.       Clinical Data: No additional findings.   Subjective: Chief Complaint  Patient presents with   Left Elbow - Pain   Right Elbow - Pain    HPI Mikayla Montes is a 61 year old female here for bilateral elbow pain worse on the left.  Right-hand-dominant.  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  Denies any numbness and tingling.  Denies any injuries or swelling.  Has intense pain.  Has tried ice, heat, rest, Biofreeze.  Review of Systems  Constitutional: Negative.   HENT: Negative.    Eyes: Negative.   Respiratory: Negative.    Cardiovascular: Negative.   Endocrine: Negative.   Musculoskeletal: Negative.   Neurological: Negative.   Hematological: Negative.   Psychiatric/Behavioral: Negative.    All other  systems reviewed and are negative.    Objective: Vital Signs: There were no vitals taken for this visit.  Physical Exam Vitals and nursing note reviewed.  Constitutional:      Appearance: She is well-developed.  HENT:     Head: Atraumatic.     Nose: Nose normal.  Eyes:     Extraocular Movements: Extraocular movements intact.  Cardiovascular:     Pulses: Normal pulses.  Pulmonary:     Effort: Pulmonary effort is normal.  Abdominal:     Palpations: Abdomen is soft.  Musculoskeletal:     Cervical back: Neck supple.  Skin:    General: Skin is warm.     Capillary Refill: Capillary refill takes less than 2 seconds.  Neurological:     Mental Status: She is alert. Mental status is at baseline.  Psychiatric:        Behavior: Behavior normal.        Thought Content: Thought content normal.        Judgment: Judgment normal.     Ortho Exam Examination of the left elbow shows tenderness to the lateral epicondyle.  She has full range of motion.  Pain with grasping and resisted wrist extension and long finger extension.  There is no tenderness to the radial tunnel.  Examination of the right elbow shows mild tenderness to the lateral epicondyle  and the olecranon.  She has full range of motion.  No tenderness to the radial tunnel. Specialty Comments:  No specialty comments available.  Imaging: XR Elbow Complete Right (3+View)  Result Date: 02/07/2022 Xrays demonstrate tiny olecranon enthesophye  XR Elbow Complete Left (3+View)  Result Date: 02/07/2022 Xrays demonstrate tiny olecranon enthesophye    PMFS History: Patient Active Problem List   Diagnosis Date Noted   Change in bowel habits 05/20/2021   Osteopenia 02/11/2021   Irritable bowel syndrome 02/11/2021   Hypothyroidism 02/11/2021   Fibromyalgia 02/11/2021   Allergic rhinitis 02/11/2021   Asthma 02/11/2021   Atrophy of vagina 02/11/2021   Menopausal symptom 02/11/2021   Special screening for malignant neoplasms,  colon 10/14/2012   Dysphagia, unspecified(787.20) 10/14/2012   Past Medical History:  Diagnosis Date   Anxiety    on meds   Bacterial vaginosis    Depression    on meds   Dyspareunia    Endometriosis    Frequency of urination    GERD (gastroesophageal reflux disease)    Irritable bowel syndrome    Lactose intolerance    Pelvic pain in female    PONV (postoperative nausea and vomiting)    Seasonal allergies    Seasonal asthma    seasonal allergy related asthma   Wears glasses     Family History  Problem Relation Age of Onset   Hypertension Mother    Other Mother    Hypertension Father    Colon cancer Neg Hx    Colon polyps Neg Hx    Rectal cancer Neg Hx    Stomach cancer Neg Hx    Esophageal cancer Neg Hx     Past Surgical History:  Procedure Laterality Date   CESAREAN SECTION     COLONOSCOPY WITH PROPOFOL  10/24/2012   Dr.Kaplan-suprep(exc)   CYSTOSCOPY WITH HYDRODISTENSION AND BIOPSY N/A 11/25/2013   Procedure: CYSTOSCOPY/HYDRODISTENSION, INSTALLATION OF MARCAINE & PYRIDIUM     ;  Surgeon: Reece Packer, MD;  Location: [facility];  Service: Urology;  Laterality: N/A;   LAPAROSCOPIC ASSISTED VAGINAL HYSTERECTOMY  03/27/1997   Endometriosis/ovarian cyst/pelvic pain   LAPAROSCOPIC OOPHERECTOMY Bilateral    LAPAROSCOPIC OVARIAN CYSTECTOMY  X3   last one 06-13-1996   last one w/ lysis adhesions,  albation endometriosis and bilataral ovarian cyst   LAPAROSCOPY Bilateral 09/19/2013   Procedure: LAPAROSCOPY OPERATIVE WITH BILATERAL SALPINGO OOPHORECTOMY AND EXCISION OF PERITONEAL MASS;  Surgeon: Anastasio Auerbach, MD;  Location: [facility];  Service: Gynecology;  Laterality: Bilateral;   TUBAL LIGATION     Social History   Occupational History   Not on file  Tobacco Use   Smoking status: Former    Packs/day: 1.00    Years: 15.00    Total pack years: 15.00    Types: Cigarettes    Quit date: 11/19/1998    Years since quitting: 23.2   Smokeless  tobacco: Never  Vaping Use   Vaping Use: Never used  Substance and Sexual Activity   Alcohol use: Not Currently    Alcohol/week: 0.0 - 2.0 standard drinks of alcohol    Comment: socially   Drug use: No   Sexual activity: Not on file

## 2022-02-07 NOTE — Addendum Note (Signed)
Addended by: Azucena Cecil on: 02/07/2022 02:16 PM   Modules accepted: Level of Service

## 2022-04-13 ENCOUNTER — Ambulatory Visit (INDEPENDENT_AMBULATORY_CARE_PROVIDER_SITE_OTHER): Payer: Worker's Compensation

## 2022-04-13 ENCOUNTER — Ambulatory Visit (INDEPENDENT_AMBULATORY_CARE_PROVIDER_SITE_OTHER): Payer: Worker's Compensation | Admitting: Orthopaedic Surgery

## 2022-04-13 ENCOUNTER — Encounter: Payer: Self-pay | Admitting: Orthopaedic Surgery

## 2022-04-13 DIAGNOSIS — M25561 Pain in right knee: Secondary | ICD-10-CM

## 2022-04-13 NOTE — Progress Notes (Signed)
Office Visit Note   Patient: Mikayla Montes           Date of Birth: [birth year]           MRN: [id number] Visit Date: 04/13/2022              Requested by: London Pepper, MD [address],  [place] [address] PCP: London Pepper, MD   Assessment & Plan: Visit Diagnoses:  1. Acute pain of right knee     Plan: Impression is improving right knee pain following injury at work on 04/03/2021.  At this point, believe the tightness in her thigh is likely coming from the small effusion in her knee.  We have discussed compression, ice and NSAIDs.  She will follow-up with [place] as needed.  Call with concerns or questions.  Follow-Up Instructions: Return if symptoms worsen or fail to improve.   Orders:  Orders Placed This Encounter  Procedures   XR KNEE 3 VIEW RIGHT   No orders of the defined types were placed in this encounter.     Procedures: No procedures performed   Clinical Data: No additional findings.   Subjective: Chief Complaint  Patient presents with   Right Knee - Injury    DOI 04/03/2022    HPI patient is a pleasant 62 year old female who comes in today for a work comp injury.  On 04/03/2021, she tripped over a loose piece of carpet falling on her right knee.  She had immediate pain and was unable to bear weight.  Symptoms have improved over the past week but she still has a fair amount of pain.  Pain is worse with flexion of the knee for which she feels a pulling sensation to her thigh.  She denies any actual locking or catching.  She has been taking Advil without relief.  No previous knee injury.  Review of Systems as detailed in HPI.  All others reviewed and are negative.   Objective: Vital Signs: There were no vitals taken for this visit.  Physical Exam well-developed well-nourished female no acute distress.  Alert and oriented x 3.  Ortho Exam right knee exam reveals small effusion.  She does have lateral joint line tenderness.   Range of motion 0 to 110 degrees.  She is stable to valgus varus stress.  No tenderness to the thigh.  Negative logroll negative straight leg raise.  Full quad strength.  She is neurovascular intact distally.  Specialty Comments:  No specialty comments available.  Imaging: XR KNEE 3 VIEW RIGHT  Result Date: 04/13/2022 No acute or structural abnormalities    PMFS History: Patient Active Problem List   Diagnosis Date Noted   Change in bowel habits 05/20/2021   Osteopenia 02/11/2021   Irritable bowel syndrome 02/11/2021   Hypothyroidism 02/11/2021   Fibromyalgia 02/11/2021   Allergic rhinitis 02/11/2021   Asthma 02/11/2021   Atrophy of vagina 02/11/2021   Menopausal symptom 02/11/2021   Special screening for malignant neoplasms, colon 10/14/2012   Dysphagia, unspecified(787.20) 10/14/2012   Past Medical History:  Diagnosis Date   Anxiety    on meds   Bacterial vaginosis    Depression    on meds   Dyspareunia    Endometriosis    Frequency of urination    GERD (gastroesophageal reflux disease)    Irritable bowel syndrome    Lactose intolerance    Pelvic pain in female    PONV (postoperative nausea and vomiting)    Seasonal  allergies    Seasonal asthma    seasonal allergy related asthma   Wears glasses     Family History  Problem Relation Age of Onset   Hypertension Mother    Other Mother    Hypertension Father    Colon cancer Neg Hx    Colon polyps Neg Hx    Rectal cancer Neg Hx    Stomach cancer Neg Hx    Esophageal cancer Neg Hx     Past Surgical History:  Procedure Laterality Date   CESAREAN SECTION     COLONOSCOPY WITH PROPOFOL  10/24/2012   Dr.Kaplan-suprep(exc)   CYSTOSCOPY WITH HYDRODISTENSION AND BIOPSY N/A 11/25/2013   Procedure: CYSTOSCOPY/HYDRODISTENSION, INSTALLATION OF MARCAINE & PYRIDIUM     ;  Surgeon: Reece Packer, MD;  Location: [facility];  Service: Urology;  Laterality: N/A;   LAPAROSCOPIC ASSISTED VAGINAL  HYSTERECTOMY  03/27/1997   Endometriosis/ovarian cyst/pelvic pain   LAPAROSCOPIC OOPHERECTOMY Bilateral    LAPAROSCOPIC OVARIAN CYSTECTOMY  X3   last one 06-13-1996   last one w/ lysis adhesions,  albation endometriosis and bilataral ovarian cyst   LAPAROSCOPY Bilateral 09/19/2013   Procedure: LAPAROSCOPY OPERATIVE WITH BILATERAL SALPINGO OOPHORECTOMY AND EXCISION OF PERITONEAL MASS;  Surgeon: Anastasio Auerbach, MD;  Location: [facility];  Service: Gynecology;  Laterality: Bilateral;   TUBAL LIGATION     Social History   Occupational History   Not on file  Tobacco Use   Smoking status: Former    Packs/day: 1.00    Years: 15.00    Total pack years: 15.00    Types: Cigarettes    Quit date: 11/19/1998    Years since quitting: 23.4   Smokeless tobacco: Never  Vaping Use   Vaping Use: Never used  Substance and Sexual Activity   Alcohol use: Not Currently    Alcohol/week: 0.0 - 2.0 standard drinks of alcohol    Comment: socially   Drug use: No   Sexual activity: Not on file

## 2022-06-01 ENCOUNTER — Other Ambulatory Visit: Payer: Self-pay | Admitting: Family Medicine

## 2022-06-01 DIAGNOSIS — Z1231 Encounter for screening mammogram for malignant neoplasm of breast: Secondary | ICD-10-CM

## 2022-06-27 ENCOUNTER — Ambulatory Visit
Admission: RE | Admit: 2022-06-27 | Discharge: 2022-06-27 | Disposition: A | Discharge status: Home / Self Care | Payer: No Typology Code available for payment source | Source: Ambulatory Visit | Attending: Family Medicine | Admitting: Family Medicine

## 2022-06-27 DIAGNOSIS — Z1231 Encounter for screening mammogram for malignant neoplasm of breast: Secondary | ICD-10-CM

## 2022-10-31 ENCOUNTER — Ambulatory Visit (INDEPENDENT_AMBULATORY_CARE_PROVIDER_SITE_OTHER): Payer: Self-pay | Admitting: Orthopaedic Surgery

## 2022-10-31 ENCOUNTER — Encounter: Payer: Self-pay | Admitting: Orthopaedic Surgery

## 2022-10-31 DIAGNOSIS — M7711 Lateral epicondylitis, right elbow: Secondary | ICD-10-CM

## 2022-10-31 DIAGNOSIS — M7712 Lateral epicondylitis, left elbow: Secondary | ICD-10-CM

## 2022-10-31 MED ORDER — LIDOCAINE HCL 1 % IJ SOLN
2.0000 mL | INTRAMUSCULAR | Status: AC | PRN
Start: 2022-10-31 — End: 2022-10-31
  Administered 2022-10-31: 2 mL

## 2022-10-31 MED ORDER — BUPIVACAINE HCL 0.5 % IJ SOLN
2.0000 mL | INTRAMUSCULAR | Status: AC | PRN
Start: 2022-10-31 — End: 2022-10-31
  Administered 2022-10-31: 2 mL via INTRA_ARTICULAR

## 2022-10-31 MED ORDER — METHYLPREDNISOLONE ACETATE 40 MG/ML IJ SUSP
40.0000 mg | INTRAMUSCULAR | Status: AC | PRN
Start: 2022-10-31 — End: 2022-10-31
  Administered 2022-10-31: 40 mg via INTRA_ARTICULAR

## 2022-10-31 NOTE — Progress Notes (Signed)
Office Visit Note   Patient: Mikayla Montes           Date of Birth: 05-Jul-1960           MRN: [id number] Visit Date: 10/31/2022              Requested by: Farris Has, MD [address] [place],  [place] [address] PCP: Farris Has, MD   Assessment & Plan: Visit Diagnoses:  1. Lateral epicondylitis, right elbow   2. Lateral epicondylitis, left elbow     Plan: Mikayla Montes is a 62 year old female with recurrent bilateral elbow lateral epicondylitis.  Previous cortisone injections were very effective.  She would like to repeat these today.  We will place work restrictions on her as well.  Follow-up as needed.  Follow-Up Instructions: No follow-ups on file.   Orders:  Orders Placed This Encounter  Procedures   Medium Joint Inj   No orders of the defined types were placed in this encounter.     Procedures: Medium Joint Inj: bilateral lateral epicondyle on 10/31/2022 8:10 AM Indications: pain Details: 25 G needle Medications (Right): 40 mg methylPREDNISolone acetate 40 MG/ML; 2 mL bupivacaine 0.5 %; 2 mL lidocaine 1 % Medications (Left): 40 mg methylPREDNISolone acetate 40 MG/ML; 2 mL bupivacaine 0.5 %; 2 mL lidocaine 1 % Outcome: tolerated well, no immediate complications Patient was prepped and draped in the usual sterile fashion.       Clinical Data: No additional findings.   Subjective: Chief Complaint  Patient presents with   Left Elbow - Pain   Right Elbow - Pain    HPI Mikayla Montes returns today for recurrent bilateral elbow pain from lateral epicondylitis. Review of Systems   Objective: Vital Signs: There were no vitals taken for this visit.  Physical Exam  Ortho Exam Examination of bilateral elbows is unchanged. Specialty Comments:  No specialty comments available.  Imaging: No results found.   PMFS History: Patient Active Problem List   Diagnosis Date Noted   Change in bowel habits 05/20/2021   Osteopenia 02/11/2021    Irritable bowel syndrome 02/11/2021   Hypothyroidism 02/11/2021   Fibromyalgia 02/11/2021   Allergic rhinitis 02/11/2021   Asthma 02/11/2021   Atrophy of vagina 02/11/2021   Menopausal symptom 02/11/2021   Special screening for malignant neoplasms, colon 10/14/2012   Dysphagia, unspecified(787.20) 10/14/2012   Past Medical History:  Diagnosis Date   Anxiety    on meds   Bacterial vaginosis    Depression    on meds   Dyspareunia    Endometriosis    Frequency of urination    GERD (gastroesophageal reflux disease)    Irritable bowel syndrome    Lactose intolerance    Pelvic pain in female    PONV (postoperative nausea and vomiting)    Seasonal allergies    Seasonal asthma    seasonal allergy related asthma   Wears glasses     Family History  Problem Relation Age of Onset   Hypertension Mother    Other Mother    Hypertension Father    Colon cancer Neg Hx    Colon polyps Neg Hx    Rectal cancer Neg Hx    Stomach cancer Neg Hx    Esophageal cancer Neg Hx     Past Surgical History:  Procedure Laterality Date   CESAREAN SECTION     COLONOSCOPY WITH PROPOFOL  10/24/2012   Dr.Kaplan-suprep(exc)   CYSTOSCOPY WITH HYDRODISTENSION AND BIOPSY N/A 11/25/2013  Procedure: CYSTOSCOPY/HYDRODISTENSION, INSTALLATION OF MARCAINE & PYRIDIUM     ;  Surgeon: Martina Sinner, MD;  Location: [facility];  Service: Urology;  Laterality: N/A;   LAPAROSCOPIC ASSISTED VAGINAL HYSTERECTOMY  03/27/1997   Endometriosis/ovarian cyst/pelvic pain   LAPAROSCOPIC OOPHERECTOMY Bilateral    LAPAROSCOPIC OVARIAN CYSTECTOMY  X3   last one 06-13-1996   last one w/ lysis adhesions,  albation endometriosis and bilataral ovarian cyst   LAPAROSCOPY Bilateral 09/19/2013   Procedure: LAPAROSCOPY OPERATIVE WITH BILATERAL SALPINGO OOPHORECTOMY AND EXCISION OF PERITONEAL MASS;  Surgeon: Dara Lords, MD;  Location: [facility];  Service: Gynecology;  Laterality: Bilateral;   TUBAL LIGATION      Social History   Occupational History   Not on file  Tobacco Use   Smoking status: Former    Current packs/day: 0.00    Average packs/day: 1 pack/day for 15.0 years (15.0 ttl pk-yrs)    Types: Cigarettes    Start date: 11/19/1983    Quit date: 11/19/1998    Years since quitting: 23.9   Smokeless tobacco: Never  Vaping Use   Vaping status: Never Used  Substance and Sexual Activity   Alcohol use: Not Currently    Alcohol/week: 0.0 - 2.0 standard drinks of alcohol    Comment: socially   Drug use: No   Sexual activity: Not on file

## 2022-11-24 ENCOUNTER — Encounter: Payer: Self-pay | Admitting: Family Medicine

## 2023-01-22 ENCOUNTER — Telehealth: Payer: Self-pay | Admitting: Orthopaedic Surgery

## 2023-01-22 NOTE — Telephone Encounter (Signed)
Patient called advised she is still having a lot of pain in her elbows. Patient aske dif there is something she can take other than Advil? The number to contact patient is [phone]

## 2023-01-23 ENCOUNTER — Other Ambulatory Visit: Payer: Self-pay | Admitting: Physician Assistant

## 2023-01-23 ENCOUNTER — Other Ambulatory Visit: Payer: Self-pay

## 2023-01-23 DIAGNOSIS — M25521 Pain in right elbow: Secondary | ICD-10-CM

## 2023-01-23 MED ORDER — TRAMADOL HCL 50 MG PO TABS: 50.0000 mg | ORAL_TABLET | Freq: Two times a day (BID) | ORAL | 1 refills | Status: DC | PRN

## 2023-01-23 NOTE — Telephone Encounter (Signed)
I can send in tramadol or tylenol 3 if she would like, but would also recommend mri if pain has returned after injections

## 2023-01-23 NOTE — Telephone Encounter (Signed)
Tramadol [facility]   Please call, she has multiple questions.

## 2023-01-23 NOTE — Telephone Encounter (Signed)
Sent in meds and spoke to patient.  Can you order mri of both elbows?

## 2023-01-24 ENCOUNTER — Telehealth: Payer: Self-pay | Admitting: Orthopaedic Surgery

## 2023-01-30 ENCOUNTER — Other Ambulatory Visit (INDEPENDENT_AMBULATORY_CARE_PROVIDER_SITE_OTHER): Payer: Self-pay

## 2023-01-30 ENCOUNTER — Ambulatory Visit (INDEPENDENT_AMBULATORY_CARE_PROVIDER_SITE_OTHER): Payer: Self-pay | Admitting: Orthopaedic Surgery

## 2023-01-30 DIAGNOSIS — M25522 Pain in left elbow: Secondary | ICD-10-CM

## 2023-01-30 DIAGNOSIS — M25521 Pain in right elbow: Secondary | ICD-10-CM

## 2023-01-30 MED ORDER — ACETAMINOPHEN-CODEINE 300-30 MG PO TABS: 1.0000 | ORAL_TABLET | Freq: Every day | ORAL | 0 refills | Status: AC | PRN

## 2023-01-30 NOTE — Progress Notes (Signed)
Office Visit Note   Patient: Mikayla Montes           Date of Birth: 10-Jan-1961           MRN: [id number] Visit Date: 01/30/2023              Requested by: Farris Has, MD [address] [place],  [place] [address] PCP: Farris Has, MD   Assessment & Plan: Visit Diagnoses:  1. Bilateral elbow joint pain     Plan: Yannis is a 62 year old female with bilateral lateral epicondylitis of the elbow.  At this point she does not want to get MRIs because she does not have health insurance.  She will continue with symptomatic treatment.  Continue same work restrictions.  I will send in Tylenol 3 to see if this will help with breakthrough pain.  I explained that this cannot be refilled to treat chronic lateral epicondylitis.  Follow-Up Instructions: No follow-ups on file.   Orders:  Orders Placed This Encounter  Procedures   XR Elbow Complete Right (3+View)   XR Elbow Complete Left (3+View)   Meds ordered this encounter  Medications   acetaminophen-codeine (TYLENOL #3) 300-30 MG tablet    Sig: Take 1-2 tablets by mouth daily as needed for up to 7 days for moderate pain (pain score 4-6).    Dispense:  20 tablet    Refill:  0      Procedures: No procedures performed   Clinical Data: No additional findings.   Subjective: Chief Complaint  Patient presents with   Right Elbow - Pain   Left Elbow - Pain    HPI Mikayla Montes returns today for bilateral elbow pain.  She has had 2 tennis elbow injections from both elbows in the last year.  She reports temporary relief.  She currently does not have insurance and wants to hold off on the MRIs for now.  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  Review of Systems   Objective: Vital Signs: There were no vitals taken for this visit.  Physical Exam  Ortho Exam Exam of bilateral elbows is unchanged.  She has a bit of depigmentation over the lateral elbow where the prior  injections were. Specialty Comments:  No specialty comments available.  Imaging: XR Elbow Complete Left (3+View)  Result Date: 01/30/2023 X-rays of the left elbow show no acute abnormalities.  No changes from prior exam.  XR Elbow Complete Right (3+View)  Result Date: 01/30/2023 X-rays of the right elbow shows no acute abnormalities.  No changes from prior x-rays.    PMFS History: Patient Active Problem List   Diagnosis Date Noted   Change in bowel habits 05/20/2021   Osteopenia 02/11/2021   Irritable bowel syndrome 02/11/2021   Hypothyroidism 02/11/2021   Fibromyalgia 02/11/2021   Allergic rhinitis 02/11/2021   Asthma 02/11/2021   Atrophy of vagina 02/11/2021   Menopausal symptom 02/11/2021   Special screening for malignant neoplasms, colon 10/14/2012   Dysphagia 10/14/2012   Past Medical History:  Diagnosis Date   Anxiety    on meds   Bacterial vaginosis    Depression    on meds   Dyspareunia    Endometriosis    Frequency of urination    GERD (gastroesophageal reflux disease)    Irritable bowel syndrome    Lactose intolerance    Pelvic pain in female    PONV (postoperative nausea and vomiting)    Seasonal allergies  Seasonal asthma    seasonal allergy related asthma   Wears glasses     Family History  Problem Relation Age of Onset   Hypertension Mother    Other Mother    Hypertension Father    Colon cancer Neg Hx    Colon polyps Neg Hx    Rectal cancer Neg Hx    Stomach cancer Neg Hx    Esophageal cancer Neg Hx     Past Surgical History:  Procedure Laterality Date   CESAREAN SECTION     COLONOSCOPY WITH PROPOFOL  10/24/2012   Dr.Kaplan-suprep(exc)   CYSTOSCOPY WITH HYDRODISTENSION AND BIOPSY N/A 11/25/2013   Procedure: CYSTOSCOPY/HYDRODISTENSION, INSTALLATION OF MARCAINE & PYRIDIUM     ;  Surgeon: Martina Sinner, MD;  Location: [facility];  Service: Urology;  Laterality: N/A;   LAPAROSCOPIC ASSISTED VAGINAL HYSTERECTOMY   03/27/1997   Endometriosis/ovarian cyst/pelvic pain   LAPAROSCOPIC OOPHERECTOMY Bilateral    LAPAROSCOPIC OVARIAN CYSTECTOMY  X3   last one 06-13-1996   last one w/ lysis adhesions,  albation endometriosis and bilataral ovarian cyst   LAPAROSCOPY Bilateral 09/19/2013   Procedure: LAPAROSCOPY OPERATIVE WITH BILATERAL SALPINGO OOPHORECTOMY AND EXCISION OF PERITONEAL MASS;  Surgeon: Dara Lords, MD;  Location: [facility];  Service: Gynecology;  Laterality: Bilateral;   TUBAL LIGATION     Social History   Occupational History   Not on file  Tobacco Use   Smoking status: Former    Current packs/day: 0.00    Average packs/day: 1 pack/day for 15.0 years (15.0 ttl pk-yrs)    Types: Cigarettes    Start date: 11/19/1983    Quit date: 11/19/1998    Years since quitting: 24.2   Smokeless tobacco: Never  Vaping Use   Vaping status: Never Used  Substance and Sexual Activity   Alcohol use: Not Currently    Alcohol/week: 0.0 - 2.0 standard drinks of alcohol    Comment: socially   Drug use: No   Sexual activity: Not on file

## 2023-02-01 ENCOUNTER — Other Ambulatory Visit: Payer: Self-pay | Admitting: Physician Assistant

## 2023-02-01 ENCOUNTER — Telehealth: Payer: Self-pay | Admitting: Orthopaedic Surgery

## 2023-02-01 MED ORDER — GABAPENTIN 100 MG PO CAPS: 100.0000 mg | ORAL_CAPSULE | Freq: Three times a day (TID) | ORAL | 0 refills | Status: AC

## 2023-02-01 NOTE — Telephone Encounter (Signed)
Sent in one rx for this

## 2023-02-01 NOTE — Telephone Encounter (Signed)
Pt called in stating Tylenol 3 is not working and she would like to try gabapentin, please advise

## 2023-02-02 ENCOUNTER — Ambulatory Visit: Payer: Self-pay | Admitting: Orthopaedic Surgery

## 2023-02-02 NOTE — Telephone Encounter (Signed)
Notified patient.

## 2023-02-04 ENCOUNTER — Other Ambulatory Visit: Payer: Self-pay | Admitting: Physician Assistant

## 2023-02-07 ENCOUNTER — Other Ambulatory Visit: Payer: Self-pay | Admitting: Physician Assistant

## 2023-02-07 ENCOUNTER — Telehealth: Payer: Self-pay | Admitting: Physician Assistant

## 2023-02-07 MED ORDER — TRAMADOL HCL 50 MG PO TABS: 50.0000 mg | ORAL_TABLET | Freq: Two times a day (BID) | ORAL | 1 refills | Status: AC | PRN

## 2023-02-07 NOTE — Telephone Encounter (Signed)
Pt called requesting tramadol. Pt states she called a few days ago. Pt phone number is [phone].

## 2023-02-07 NOTE — Telephone Encounter (Signed)
Sent to [facility] on file

## 2023-07-20 ENCOUNTER — Other Ambulatory Visit: Payer: Self-pay | Admitting: Family Medicine

## 2023-07-20 DIAGNOSIS — Z1231 Encounter for screening mammogram for malignant neoplasm of breast: Secondary | ICD-10-CM

## 2023-07-30 ENCOUNTER — Ambulatory Visit
Admission: RE | Admit: 2023-07-30 | Discharge: 2023-07-30 | Disposition: A | Discharge status: Home / Self Care | Payer: Self-pay | Source: Ambulatory Visit | Attending: Family Medicine | Admitting: Family Medicine

## 2023-07-30 DIAGNOSIS — Z1231 Encounter for screening mammogram for malignant neoplasm of breast: Secondary | ICD-10-CM

## 2023-08-10 IMAGING — MG DIGITAL SCREENING BILAT W/ CAD
4 series · 4 of 4 positions shown · non-contrast
Comparison: Previous exam(s).

CLINICAL DATA: Screening.

EXAM:
DIGITAL SCREENING BILATERAL MAMMOGRAM WITH CAD
TECHNIQUE: Bilateral screening digital craniocaudal and mediolateral oblique
mammograms were obtained. The images were evaluated with
computer-aided detection.

[L MLO]
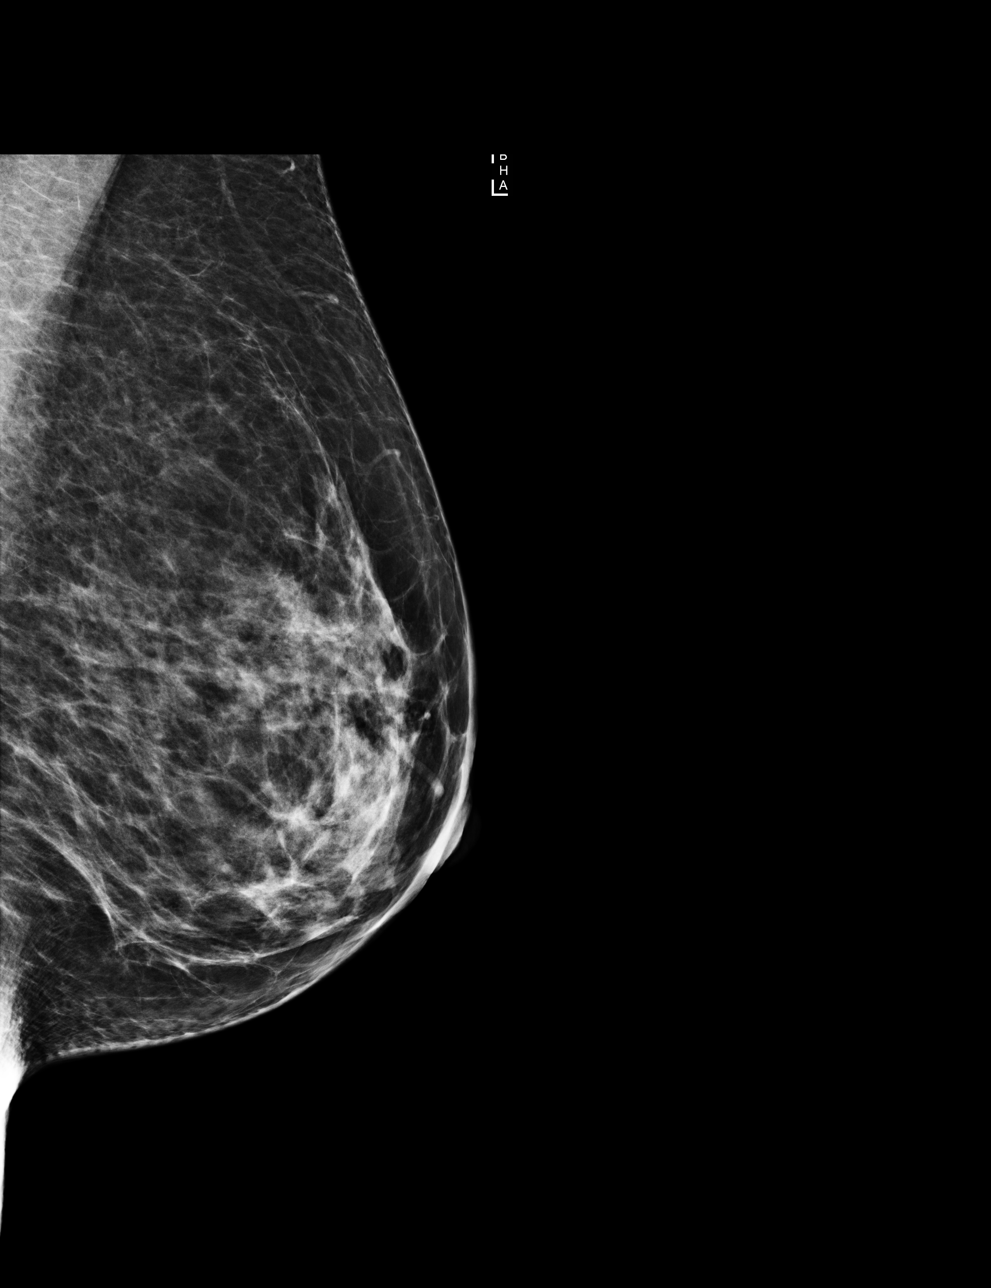

[R MLO]
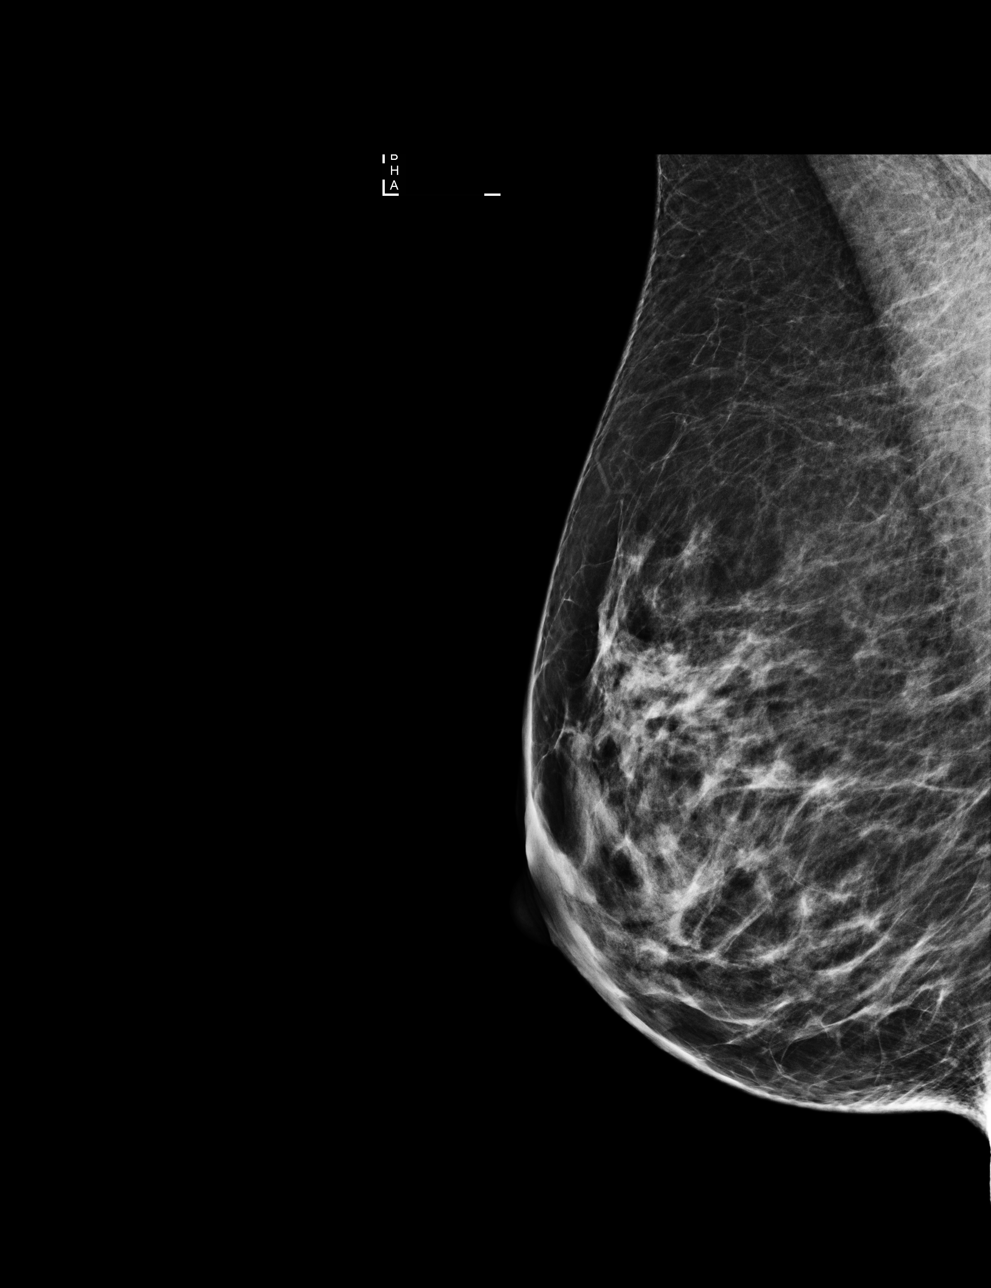

[L CC]
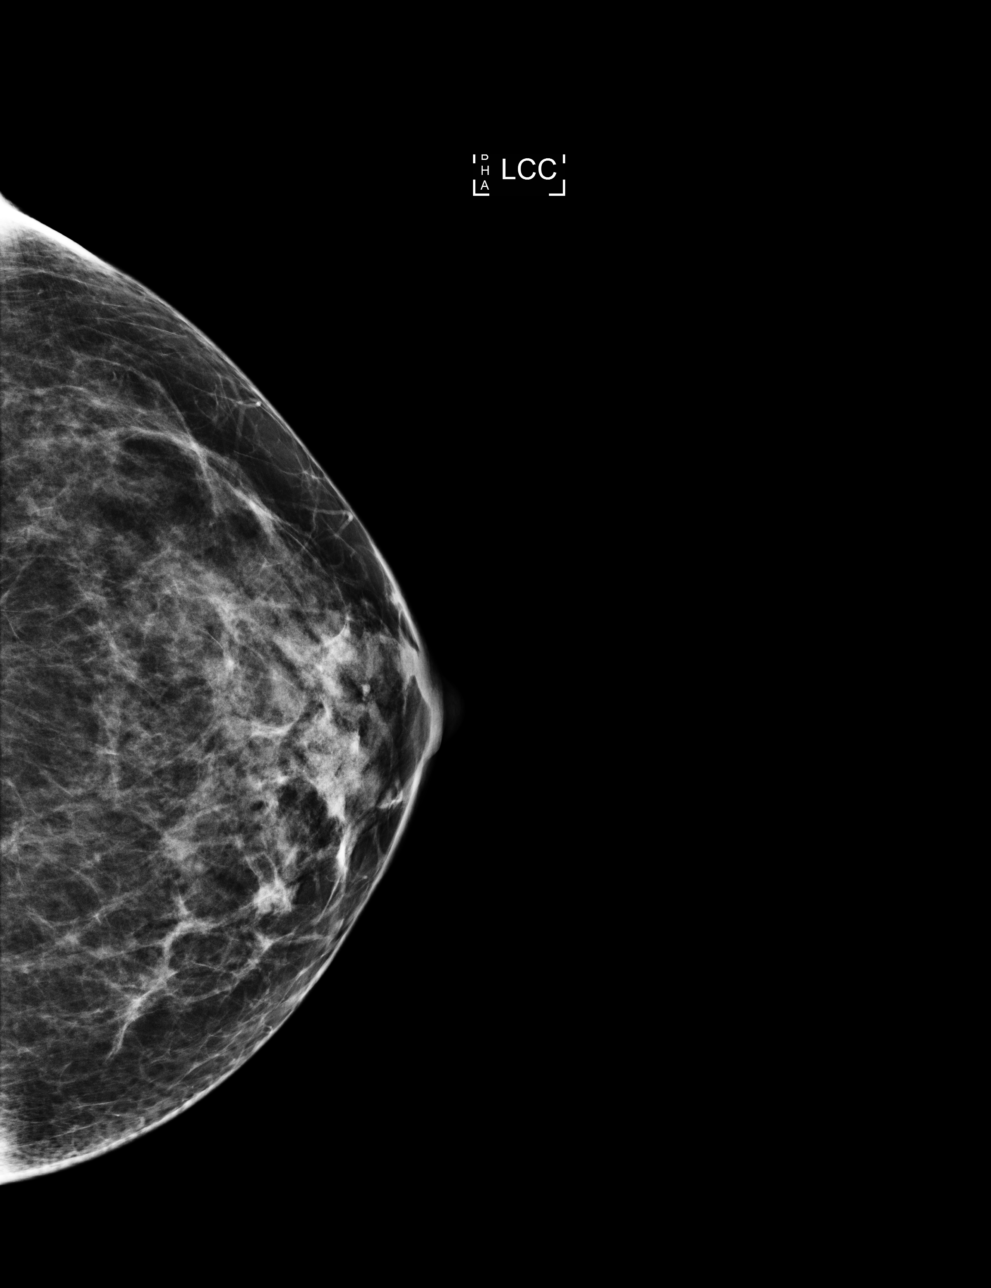

[R CC]
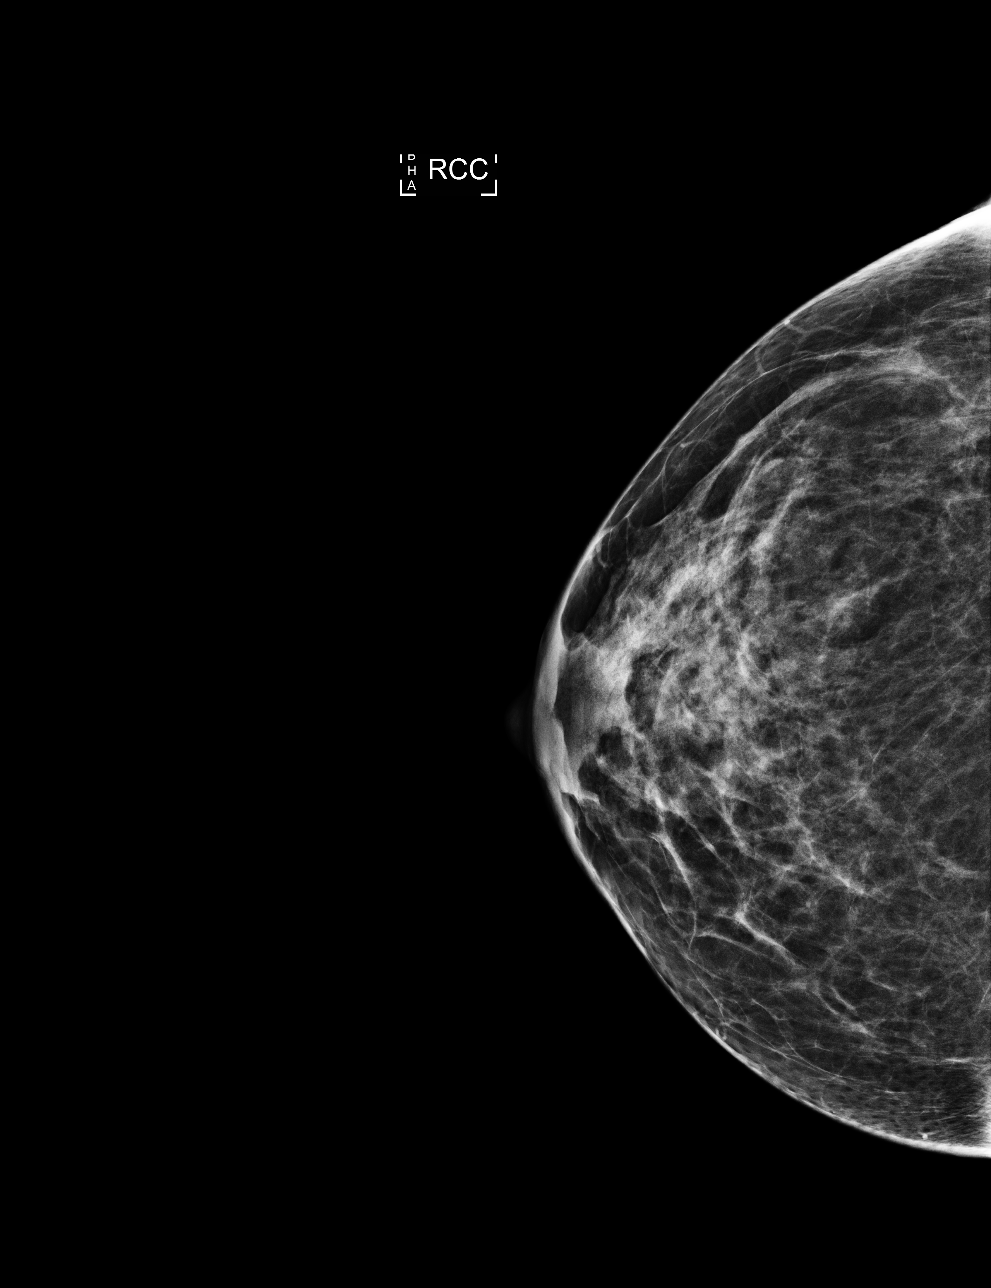

[4 of 4 positions shown; findings below may reference images not displayed]

ACR Breast Density Category c: The breast tissue is heterogeneously
dense, which may obscure small masses.
FINDINGS: There are no findings suspicious for malignancy.
IMPRESSION: No mammographic evidence of malignancy. A result letter of this
screening mammogram will be mailed directly to the patient.

RECOMMENDATION:
Screening mammogram in one year. (Code:TJ-B-ZPA)

BI-RADS CATEGORY  1: Negative.

## 2024-01-28 ENCOUNTER — Encounter: Payer: Self-pay | Admitting: Radiology
